# Patient Record
Sex: Male | Born: 1945 | Race: White | Hispanic: No | Smoking: Never smoker
Health system: Southern US, Community
[De-identification: ages and names within clinical notes are randomized; demographics above are authoritative.]

## PROBLEM LIST (undated history)

## (undated) DIAGNOSIS — I10 Essential (primary) hypertension: Secondary | ICD-10-CM

## (undated) DIAGNOSIS — E119 Type 2 diabetes mellitus without complications: Secondary | ICD-10-CM

## (undated) DIAGNOSIS — N179 Acute kidney failure, unspecified: Secondary | ICD-10-CM

## (undated) HISTORY — PX: KNEE ARTHROSCOPY: SUR90

## (undated) HISTORY — PX: PACEMAKER IMPLANT: EP1218

---

## 2013-10-02 ENCOUNTER — Emergency Department (HOSPITAL_BASED_OUTPATIENT_CLINIC_OR_DEPARTMENT_OTHER): Payer: Medicare Other

## 2013-10-02 ENCOUNTER — Encounter (HOSPITAL_BASED_OUTPATIENT_CLINIC_OR_DEPARTMENT_OTHER): Payer: Self-pay | Admitting: Emergency Medicine

## 2013-10-02 ENCOUNTER — Emergency Department (HOSPITAL_BASED_OUTPATIENT_CLINIC_OR_DEPARTMENT_OTHER)
Admission: EM | Admit: 2013-10-02 | Discharge: 2013-10-02 | Disposition: A | Discharge status: Home / Self Care | Payer: Medicare Other | Attending: Emergency Medicine | Admitting: Emergency Medicine

## 2013-10-02 DIAGNOSIS — E119 Type 2 diabetes mellitus without complications: Secondary | ICD-10-CM | POA: Insufficient documentation

## 2013-10-02 DIAGNOSIS — Z79899 Other long term (current) drug therapy: Secondary | ICD-10-CM | POA: Insufficient documentation

## 2013-10-02 DIAGNOSIS — J069 Acute upper respiratory infection, unspecified: Secondary | ICD-10-CM | POA: Insufficient documentation

## 2013-10-02 HISTORY — DX: Type 2 diabetes mellitus without complications: E11.9

## 2013-10-02 MED ORDER — BENZONATATE 100 MG PO CAPS: 100.0000 mg | ORAL_CAPSULE | Freq: Three times a day (TID) | ORAL | Status: DC

## 2013-10-02 NOTE — ED Notes (Signed)
MD at bedside. 

## 2013-10-02 NOTE — ED Notes (Signed)
Pt reports productive cough x couple days.

## 2013-10-02 NOTE — ED Provider Notes (Signed)
CSN: [id number]     Arrival date & time 10/02/13  1443 History  This chart was scribed for Rolland Porter, MD by Charline Bills, ED Scribe. The patient was seen in room MH05/MH05. Patient's care was started at 3:44 PM.   Chief Complaint  Patient presents with  . Cough   The history is provided by the patient. No language interpreter was used.   HPI Comments: Yansel Bentz is a 68 y.o. male, with a h/o DM, who presents to the Emergency Department complaining of dry cough onset yesterday. Pt also reports associated sore throat, rhinorrhea. He denies fever, HA, chest pain. Pt also states that he had a rash a few days ago that resolved after one prednisone injection.   Past Medical History  Diagnosis Date  . Diabetes mellitus without complication    History reviewed. No pertinent past surgical history. History reviewed. No pertinent family history. History  Substance Use Topics  . Smoking status: Never Smoker   . Smokeless tobacco: Not on file  . Alcohol Use: No    Review of Systems  Constitutional: Negative for fever, chills, diaphoresis, appetite change and fatigue.  HENT: Positive for rhinorrhea and sore throat. Negative for mouth sores and trouble swallowing.   Eyes: Negative for visual disturbance.  Respiratory: Positive for cough. Negative for chest tightness, shortness of breath and wheezing.   Cardiovascular: Negative for chest pain.  Gastrointestinal: Negative for nausea, vomiting, abdominal pain, diarrhea and abdominal distention.  Endocrine: Negative for polydipsia, polyphagia and polyuria.  Genitourinary: Negative for dysuria, frequency and hematuria.  Musculoskeletal: Negative for gait problem.  Skin: Negative for color change, pallor and rash.  Neurological: Negative for dizziness, syncope, light-headedness and headaches.  Hematological: Does not bruise/bleed easily.  Psychiatric/Behavioral: Negative for behavioral problems and confusion.  All other systems reviewed and are  negative.  Allergies  Crestor; Iodides; and Sulfa antibiotics  Home Medications   Prior to Admission medications   Medication Sig Start Date End Date Taking? Authorizing Provider  metFORMIN (GLUCOPHAGE) 1000 MG tablet Take 1,000 mg by mouth 2 (two) times daily with a meal.   Yes Historical Provider, MD  sertraline (ZOLOFT) 100 MG tablet Take 100 mg by mouth daily.   Yes Historical Provider, MD  benzonatate (TESSALON) 100 MG capsule Take 1 capsule (100 mg total) by mouth every 8 (eight) hours. 10/02/13   Rolland Porter, MD   Triage Vitals: BP 118/73  Pulse 60  Temp(Src) 98.8 F (37.1 C) (Oral)  Resp 18  Ht 5\' 10"  (1.778 m)  Wt 170 lb (77.111 kg)  BMI 24.39 kg/m2  SpO2 98% Physical Exam  Constitutional: He is oriented to person, place, and time. He appears well-developed and well-nourished. No distress.  HENT:  Head: Normocephalic.  Right Ear: Tympanic membrane normal.  Left Ear: Tympanic membrane normal.  Nose: Nose normal.  Mouth/Throat: Oropharynx is clear and moist and mucous membranes are normal.  Eyes: Conjunctivae are normal. Pupils are equal, round, and reactive to light. No scleral icterus.  Neck: Normal range of motion. Neck supple. No mass and no thyromegaly present.  Cardiovascular: Normal rate and regular rhythm.  Exam reveals no gallop and no friction rub.   No murmur heard. Pulmonary/Chest: Effort normal and breath sounds normal. No respiratory distress. He has no wheezes. He has no rales.  Abdominal: Soft. Bowel sounds are normal. He exhibits no distension. There is no tenderness. There is no rebound.  Musculoskeletal: Normal range of motion.  Lymphadenopathy:    He has no  cervical adenopathy.  Neurological: He is alert and oriented to person, place, and time.  Skin: Skin is warm and dry. No rash noted.  Psychiatric: He has a normal mood and affect. His behavior is normal.    ED Course  Procedures (including critical care time) DIAGNOSTIC STUDIES: Oxygen  Saturation is 98% on RA, normal by my interpretation.    COORDINATION OF CARE: 3:48 PM Discussed treatment plan with pt at bedside and pt agreed to plan.  Labs Review Labs Reviewed - No data to display  Imaging Review Dg Chest 2 View  10/02/2013   CLINICAL DATA:  Productive cough.  Shortness of breath.  EXAM: CHEST  2 VIEW  COMPARISON:  None.  FINDINGS: The heart size and mediastinal contours are within normal limits. Both lungs are clear. The visualized skeletal structures are unremarkable.  IMPRESSION: No active cardiopulmonary disease.   Electronically Signed   By: Maisie Fushomas  Register   On: 10/02/2013 16:17     EKG Interpretation None      MDM   Final diagnoses:  Upper respiratory infection    Normal CXR.  Normal exam.  I personally performed the services described in this documentation, which was scribed in my presence. The recorded information has been reviewed and is accurate.    Rolland PorterMark Yovan Leeman, MD 10/02/13 [phone]

## 2013-10-02 NOTE — ED Notes (Signed)
Pt directed to pharmacy to pick up prescriptions-  

## 2013-10-02 NOTE — Discharge Instructions (Signed)
Upper Respiratory Infection, Adult °An upper respiratory infection (URI) is also known as the common cold. It is often caused by a type of germ (virus). Colds are easily spread (contagious). You can pass it to others by kissing, coughing, sneezing, or drinking out of the same glass. Usually, you get better in 1 or 2 weeks.  °HOME CARE  °· Only take medicine as told by your doctor. °· Use a warm mist humidifier or breathe in steam from a hot shower. °· Drink enough water and fluids to keep your pee (urine) clear or pale yellow. °· Get plenty of rest. °· Return to work when your temperature is back to normal or as told by your doctor. You may use a face mask and wash your hands to stop your cold from spreading. °GET HELP RIGHT AWAY IF:  °· After the first few days, you feel you are getting worse. °· You have questions about your medicine. °· You have chills, shortness of breath, or brown or red spit (mucus). °· You have yellow or brown snot (nasal discharge) or pain in the face, especially when you bend forward. °· You have a fever, puffy (swollen) neck, pain when you swallow, or white spots in the back of your throat. °· You have a bad headache, ear pain, sinus pain, or chest pain. °· You have a high-pitched whistling sound when you breathe in and out (wheezing). °· You have a lasting cough or cough up blood. °· You have sore muscles or a stiff neck. °MAKE SURE YOU:  °· Understand these instructions. °· Will watch your condition. °· Will get help right away if you are not doing well or get worse. °Document Released: 10/07/2007 Document Revised: 07/13/2011 Document Reviewed: 08/25/2010 °ExitCare® Patient Information ©2014 [employer]. ° °Viral Infections °A virus is a type of germ. Viruses can cause: °· Minor sore throats. °· Aches and pains. °· Headaches. °· Runny nose. °· Rashes. °· Watery eyes. °· Tiredness. °· Coughs. °· Loss of appetite. °· Feeling sick to your stomach (nausea). °· Throwing up  (vomiting). °· Watery poop (diarrhea). °HOME CARE  °· Only take medicines as told by your doctor. °· Drink enough water and fluids to keep your pee (urine) clear or pale yellow. Sports drinks are a good choice. °· Get plenty of rest and eat healthy. Soups and broths with crackers or rice are fine. °GET HELP RIGHT AWAY IF:  °· You have a very bad headache. °· You have shortness of breath. °· You have chest pain or neck pain. °· You have an unusual rash. °· You cannot stop throwing up. °· You have watery poop that does not stop. °· You cannot keep fluids down. °· You or your child has a temperature by mouth above 102° F (38.9° C), not controlled by medicine. °· Your baby is older than 3 months with a rectal temperature of 102° F (38.9° C) or higher. °· Your baby is 3 months old or younger with a rectal temperature of 100.4° F (38° C) or higher. °MAKE SURE YOU:  °· Understand these instructions. °· Will watch this condition. °· Will get help right away if you are not doing well or get worse. °Document Released: 04/02/2008 Document Revised: 07/13/2011 Document Reviewed: 08/26/2010 °ExitCare® Patient Information ©2014 [employer]. ° °

## 2015-01-19 ENCOUNTER — Encounter (HOSPITAL_BASED_OUTPATIENT_CLINIC_OR_DEPARTMENT_OTHER): Payer: Self-pay | Admitting: Emergency Medicine

## 2015-01-19 ENCOUNTER — Emergency Department (HOSPITAL_BASED_OUTPATIENT_CLINIC_OR_DEPARTMENT_OTHER): Payer: Medicare Other

## 2015-01-19 ENCOUNTER — Emergency Department (HOSPITAL_BASED_OUTPATIENT_CLINIC_OR_DEPARTMENT_OTHER)
Admission: EM | Admit: 2015-01-19 | Discharge: 2015-01-19 | Disposition: A | Discharge status: Home / Self Care | Payer: Medicare Other | Attending: Emergency Medicine | Admitting: Emergency Medicine

## 2015-01-19 DIAGNOSIS — S81832A Puncture wound without foreign body, left lower leg, initial encounter: Secondary | ICD-10-CM | POA: Insufficient documentation

## 2015-01-19 DIAGNOSIS — Y998 Other external cause status: Secondary | ICD-10-CM | POA: Insufficient documentation

## 2015-01-19 DIAGNOSIS — E119 Type 2 diabetes mellitus without complications: Secondary | ICD-10-CM | POA: Diagnosis not present

## 2015-01-19 DIAGNOSIS — Z23 Encounter for immunization: Secondary | ICD-10-CM | POA: Insufficient documentation

## 2015-01-19 DIAGNOSIS — Z79899 Other long term (current) drug therapy: Secondary | ICD-10-CM | POA: Diagnosis not present

## 2015-01-19 DIAGNOSIS — Y9389 Activity, other specified: Secondary | ICD-10-CM | POA: Insufficient documentation

## 2015-01-19 DIAGNOSIS — S81812A Laceration without foreign body, left lower leg, initial encounter: Secondary | ICD-10-CM | POA: Diagnosis present

## 2015-01-19 DIAGNOSIS — S8012XA Contusion of left lower leg, initial encounter: Secondary | ICD-10-CM | POA: Insufficient documentation

## 2015-01-19 DIAGNOSIS — Z7982 Long term (current) use of aspirin: Secondary | ICD-10-CM | POA: Insufficient documentation

## 2015-01-19 DIAGNOSIS — Y288XXA Contact with other sharp object, undetermined intent, initial encounter: Secondary | ICD-10-CM | POA: Insufficient documentation

## 2015-01-19 DIAGNOSIS — T148XXA Other injury of unspecified body region, initial encounter: Secondary | ICD-10-CM

## 2015-01-19 DIAGNOSIS — Y9289 Other specified places as the place of occurrence of the external cause: Secondary | ICD-10-CM | POA: Insufficient documentation

## 2015-01-19 MED ORDER — CEPHALEXIN 500 MG PO CAPS: 500.0000 mg | ORAL_CAPSULE | Freq: Four times a day (QID) | ORAL | Status: DC

## 2015-01-19 MED ORDER — TETANUS-DIPHTH-ACELL PERTUSSIS 5-2.5-18.5 LF-MCG/0.5 IM SUSP
0.5000 mL | Freq: Once | INTRAMUSCULAR | Status: AC
  Administered 2015-01-19: 0.5 mL via INTRAMUSCULAR
  Filled 2015-01-19: qty 0.5

## 2015-01-19 MED ORDER — CEPHALEXIN 250 MG PO CAPS
500.0000 mg | ORAL_CAPSULE | Freq: Once | ORAL | Status: AC
  Administered 2015-01-19: 500 mg via ORAL
  Filled 2015-01-19: qty 2

## 2015-01-19 NOTE — ED Notes (Signed)
Cut leg with rusty hand sickle yard tool

## 2015-01-19 NOTE — ED Notes (Signed)
Pt states he is currently NOT UTD on Tetanus

## 2015-01-19 NOTE — Discharge Instructions (Signed)
Apply ice for the first 2-3 days and then transition to heat to help break up the hematoma.  Keep wound dry and do not remove dressing for 24 hours if possible. After that, wash gently morning and night (every 12 hours) with soap and water. Use a topical antibiotic ointment and cover with a bandaid or gauze.    Do NOT use rubbing alcohol or hydrogen peroxide, do not soak the area   Every attempt was made to remove foreign body (contaminants) from the wound.  However, there is always a chance that some may remain in the wound. This can  increase your risk of infection.   If you see signs of infection (warmth, redness, tenderness, pus, sharp increase in pain, fever, red streaking in the skin) immediately return to the emergency department.    Please follow with your primary care doctor in the next 2 days for a check-up. They must obtain records for further management.   Do not hesitate to return to the Emergency Department for any new, worsening or concerning symptoms.

## 2015-01-19 NOTE — ED Provider Notes (Signed)
CSN: [id number]     Arrival date & time 01/19/15  1547 History   First MD Initiated Contact with Patient 01/19/15 1620     Chief Complaint  Patient presents with  . Extremity Laceration     (Consider location/radiation/quality/duration/timing/severity/associated sxs/prior Treatment) HPI   Blood pressure 142/95, pulse 58, temperature 98.1 F (36.7 C), temperature source Oral, resp. rate 18, height 5\' 10"  (1.778 m), weight 175 lb (79.379 kg), SpO2 99 %.  Nathaniel Davis is a 69 y.o. male complaining of laceration to left lower extremity after patient cut the leg with a resting hand cycle while working in the yard. States that initially the bleeding was difficult to control these denies pulsatile bleeding. Last tetanus shot is unknown. Pain is mild.  Past Medical History  Diagnosis Date  . Diabetes mellitus without complication    History reviewed. No pertinent past surgical history. No family history on file. Social History  Substance Use Topics  . Smoking status: Never Smoker   . Smokeless tobacco: None  . Alcohol Use: No    Review of Systems  10 systems reviewed and found to be negative, except as noted in the HPI.   Allergies  Crestor; Iodides; and Sulfa antibiotics  Home Medications   Prior to Admission medications   Medication Sig Start Date End Date Taking? Authorizing Geovana Gebel  ALPRAZolam Prudy Feeler) 0.5 MG tablet Take 0.5 mg by mouth at bedtime as needed for anxiety.   Yes Historical Bellarose Burtt, MD  aspirin 81 MG tablet Take 81 mg by mouth daily.   Yes Historical Beauregard Jarrells, MD  atenolol (TENORMIN) 50 MG tablet Take 50 mg by mouth daily.   Yes Historical Haward Pope, MD  carbamazepine (TEGRETOL) 200 MG tablet Take 200 mg by mouth 3 (three) times daily.   Yes Historical Eyla Tallon, MD  diphenoxylate-atropine (LOMOTIL) 2.5-0.025 MG per tablet Take by mouth 4 (four) times daily as needed for diarrhea or loose stools.   Yes Historical Nasim Habeeb, MD  doxepin (SINEQUAN) 25 MG capsule  Take 25 mg by mouth.   Yes Historical Shirlyn Savin, MD  HYDROcodone-acetaminophen (NORCO) 10-325 MG per tablet Take 1 tablet by mouth every 6 (six) hours as needed.   Yes Historical Takyah Ciaramitaro, MD  hydrOXYzine (ATARAX/VISTARIL) 25 MG tablet Take 25 mg by mouth 3 (three) times daily as needed.   Yes Historical Avri Paiva, MD  indomethacin (INDOCIN) 50 MG capsule Take 50 mg by mouth 2 (two) times daily with a meal.   Yes Historical Jerric Oyen, MD  pantoprazole (PROTONIX) 40 MG tablet Take 40 mg by mouth daily.   Yes Historical Tai Skelly, MD  pravastatin (PRAVACHOL) 10 MG tablet Take 10 mg by mouth daily.   Yes Historical Malacki Mcphearson, MD  promethazine (PHENERGAN) 25 MG tablet Take 25 mg by mouth every 6 (six) hours as needed for nausea or vomiting.   Yes Historical Laityn Bensen, MD  benzonatate (TESSALON) 100 MG capsule Take 1 capsule (100 mg total) by mouth every 8 (eight) hours. 10/02/13   Rolland Porter, MD  metFORMIN (GLUCOPHAGE) 1000 MG tablet Take 500 mg by mouth 2 (two) times daily with a meal.     Historical Reghan Thul, MD  sertraline (ZOLOFT) 100 MG tablet Take 100 mg by mouth daily.    Historical Courtnee Myer, MD   BP 142/95 mmHg  Pulse 58  Temp(Src) 98.1 F (36.7 C) (Oral)  Resp 18  Ht 5\' 10"  (1.778 m)  Wt 175 lb (79.379 kg)  BMI 25.11 kg/m2  SpO2 99% Physical Exam  Constitutional: He is oriented to  person, place, and time. He appears well-developed and well-nourished. No distress.  HENT:  Head: Normocephalic and atraumatic.  Mouth/Throat: Oropharynx is clear and moist.  Eyes: Conjunctivae and EOM are normal. Pupils are equal, round, and reactive to light.  Neck: Normal range of motion.  Cardiovascular: Normal rate and regular rhythm.   Pulmonary/Chest: Effort normal and breath sounds normal. No stridor.  Musculoskeletal: Normal range of motion.  Neurological: He is alert and oriented to person, place, and time.  Skin:  6 mm puncture wound to left shin on the medial side with bleeding controlled, there is  a underlying hematoma that is not tender to palpation. He is distally neurovascularly intact, no underlying crepitance.  Psychiatric: He has a normal mood and affect.  Nursing note and vitals reviewed.   ED Course  Procedures (including critical care time) Labs Review Labs Reviewed - No data to display  Imaging Review No results found. I have personally reviewed and evaluated these images and lab results as part of my medical decision-making.   EKG Interpretation None      MDM   Final diagnoses:  Puncture wound  Hematoma    Filed Vitals:   01/19/15 1557  BP: 142/95  Pulse: 58  Temp: 98.1 F (36.7 C)  TempSrc: Oral  Resp: 18  Height:  (1.778 m)  Weight: 175 lb (79.379 kg)  SpO2: 99%    Medications  Tdap (BOOSTRIX) injection 0.5 mL (0.5 mLs Intramuscular Given 01/19/15 1648)  cephALEXin (KEFLEX) capsule 500 mg (500 mg Oral Given 01/19/15 1652)    Nathaniel Davis is a pleasant 69 y.o. male presenting with sites wound to left lower anterior leg, this is a puncture wound. Patient states that this was a rusty told. His tetanus is updated. He is diabetic, will start him on antibiotics. X-ray with no bony abnormality or foreign body. Wound is irrigated and outpatient on wound care and return precautions, this very high risk wound considering the underlying hematoma and his diabetes. Her on prophylactic Keflex.  Evaluation does not show pathology that would require ongoing emergent intervention or inpatient treatment. Pt is hemodynamically stable and mentating appropriately. Discussed findings and plan with patient/guardian, who agrees with care plan. All questions answered. Return precautions discussed and outpatient follow up given.   Discharge Medication List as of 01/19/2015  5:59 PM    START taking these medications   Details  cephALEXin (KEFLEX) 500 MG capsule Take 1 capsule (500 mg total) by mouth 4 (four) times daily., Starting 01/19/2015, Until Discontinued, [employer], PA-C 01/20/15 0011  Elwin Mocha, MD 01/20/15 [phone]

## 2015-01-19 NOTE — ED Notes (Signed)
Pt was using a hedge trimming blade and caught it on his shin.  1cm laceration to left leg, bleeding controlled by pressure dressing.

## 2015-01-19 NOTE — ED Notes (Signed)
Presents with laceration to left ant lower leg area, medial of tibial bone area, no active bleeding noted at this time, area has some swelling noted, pt arrived with 2x2 secured by tape.

## 2015-12-07 ENCOUNTER — Emergency Department (HOSPITAL_BASED_OUTPATIENT_CLINIC_OR_DEPARTMENT_OTHER): Payer: Medicare Other

## 2015-12-07 ENCOUNTER — Encounter (HOSPITAL_BASED_OUTPATIENT_CLINIC_OR_DEPARTMENT_OTHER): Payer: Self-pay | Admitting: *Deleted

## 2015-12-07 ENCOUNTER — Inpatient Hospital Stay (HOSPITAL_BASED_OUTPATIENT_CLINIC_OR_DEPARTMENT_OTHER)
Admission: EM | Admit: 2015-12-07 | Discharge: 2015-12-09 | DRG: 069 | Disposition: A | Discharge status: Home / Self Care | Payer: Medicare Other | Attending: Internal Medicine | Admitting: Internal Medicine

## 2015-12-07 ENCOUNTER — Observation Stay (HOSPITAL_COMMUNITY): Payer: Medicare Other

## 2015-12-07 DIAGNOSIS — Z6825 Body mass index (BMI) 25.0-25.9, adult: Secondary | ICD-10-CM

## 2015-12-07 DIAGNOSIS — I10 Essential (primary) hypertension: Secondary | ICD-10-CM | POA: Diagnosis present

## 2015-12-07 DIAGNOSIS — Z79899 Other long term (current) drug therapy: Secondary | ICD-10-CM

## 2015-12-07 DIAGNOSIS — R42 Dizziness and giddiness: Secondary | ICD-10-CM

## 2015-12-07 DIAGNOSIS — Z7982 Long term (current) use of aspirin: Secondary | ICD-10-CM

## 2015-12-07 DIAGNOSIS — Z888 Allergy status to other drugs, medicaments and biological substances status: Secondary | ICD-10-CM

## 2015-12-07 DIAGNOSIS — G459 Transient cerebral ischemic attack, unspecified: Secondary | ICD-10-CM | POA: Diagnosis not present

## 2015-12-07 DIAGNOSIS — Z882 Allergy status to sulfonamides status: Secondary | ICD-10-CM

## 2015-12-07 DIAGNOSIS — G43909 Migraine, unspecified, not intractable, without status migrainosus: Secondary | ICD-10-CM | POA: Diagnosis present

## 2015-12-07 DIAGNOSIS — F329 Major depressive disorder, single episode, unspecified: Secondary | ICD-10-CM | POA: Diagnosis present

## 2015-12-07 DIAGNOSIS — E119 Type 2 diabetes mellitus without complications: Secondary | ICD-10-CM

## 2015-12-07 DIAGNOSIS — E785 Hyperlipidemia, unspecified: Secondary | ICD-10-CM | POA: Diagnosis present

## 2015-12-07 DIAGNOSIS — R4781 Slurred speech: Secondary | ICD-10-CM | POA: Diagnosis present

## 2015-12-07 DIAGNOSIS — R270 Ataxia, unspecified: Secondary | ICD-10-CM

## 2015-12-07 DIAGNOSIS — N179 Acute kidney failure, unspecified: Secondary | ICD-10-CM | POA: Diagnosis present

## 2015-12-07 DIAGNOSIS — R29701 NIHSS score 1: Secondary | ICD-10-CM | POA: Diagnosis present

## 2015-12-07 DIAGNOSIS — R001 Bradycardia, unspecified: Secondary | ICD-10-CM | POA: Diagnosis present

## 2015-12-07 DIAGNOSIS — Z7984 Long term (current) use of oral hypoglycemic drugs: Secondary | ICD-10-CM

## 2015-12-07 HISTORY — DX: Acute kidney failure, unspecified: N17.9

## 2015-12-07 HISTORY — DX: Essential (primary) hypertension: I10

## 2015-12-07 LAB — DIFFERENTIAL
Basophils Absolute: 0 10*3/uL (ref 0.0–0.1)
Basophils Relative: 0 %
Eosinophils Absolute: 0.2 10*3/uL (ref 0.0–0.7)
Eosinophils Relative: 3 %
LYMPHS PCT: 28 %
Lymphs Abs: 1.9 10*3/uL (ref 0.7–4.0)
MONO ABS: 0.4 10*3/uL (ref 0.1–1.0)
Monocytes Relative: 6 %
NEUTROS ABS: 4.2 10*3/uL (ref 1.7–7.7)
Neutrophils Relative %: 63 %

## 2015-12-07 LAB — CBC
HCT: 37.7 % — ABNORMAL LOW (ref 39.0–52.0)
HCT: 39.5 % (ref 39.0–52.0)
HEMOGLOBIN: 12.5 g/dL — AB (ref 13.0–17.0)
Hemoglobin: 12.8 g/dL — ABNORMAL LOW (ref 13.0–17.0)
MCH: 31 pg (ref 26.0–34.0)
MCH: 30.5 pg (ref 26.0–34.0)
MCHC: 33.2 g/dL (ref 30.0–36.0)
MCHC: 32.4 g/dL (ref 30.0–36.0)
MCV: 93.5 fL (ref 78.0–100.0)
MCV: 94 fL (ref 78.0–100.0)
PLATELETS: 148 10*3/uL — AB (ref 150–400)
PLATELETS: 170 10*3/uL (ref 150–400)
RBC: 4.03 MIL/uL — AB (ref 4.22–5.81)
RBC: 4.2 MIL/uL — AB (ref 4.22–5.81)
RDW: 12.6 % (ref 11.5–15.5)
RDW: 12.8 % (ref 11.5–15.5)
WBC: 6.7 10*3/uL (ref 4.0–10.5)
WBC: 7.6 10*3/uL (ref 4.0–10.5)

## 2015-12-07 LAB — URINALYSIS, ROUTINE W REFLEX MICROSCOPIC
Bilirubin Urine: NEGATIVE
Glucose, UA: NEGATIVE mg/dL
Hgb urine dipstick: NEGATIVE
Ketones, ur: NEGATIVE mg/dL
Leukocytes, UA: NEGATIVE
NITRITE: NEGATIVE
PROTEIN: NEGATIVE mg/dL
Specific Gravity, Urine: 1.021 (ref 1.005–1.030)
pH: 6.5 (ref 5.0–8.0)

## 2015-12-07 LAB — COMPREHENSIVE METABOLIC PANEL
ALT: 24 U/L (ref 17–63)
AST: 26 U/L (ref 15–41)
Albumin: 4.2 g/dL (ref 3.5–5.0)
Alkaline Phosphatase: 67 U/L (ref 38–126)
Anion gap: 7 (ref 5–15)
BUN: 28 mg/dL — ABNORMAL HIGH (ref 6–20)
CALCIUM: 8.9 mg/dL (ref 8.9–10.3)
CO2: 28 mmol/L (ref 22–32)
CREATININE: 1.31 mg/dL — AB (ref 0.61–1.24)
Chloride: 104 mmol/L (ref 101–111)
GFR calc non Af Amer: 54 mL/min — ABNORMAL LOW (ref >60)
GLUCOSE: 135 mg/dL — AB (ref 65–99)
Potassium: 4.6 mmol/L (ref 3.5–5.1)
SODIUM: 139 mmol/L (ref 135–145)
Total Bilirubin: 0.5 mg/dL (ref 0.3–1.2)
Total Protein: 6.9 g/dL (ref 6.5–8.1)

## 2015-12-07 LAB — RAPID URINE DRUG SCREEN, HOSP PERFORMED
Amphetamines: NOT DETECTED
Barbiturates: NOT DETECTED
Benzodiazepines: POSITIVE — AB
Cocaine: NOT DETECTED
Opiates: NOT DETECTED
Tetrahydrocannabinol: NOT DETECTED

## 2015-12-07 LAB — CREATININE, SERUM
CREATININE: 1.22 mg/dL (ref 0.61–1.24)
GFR, EST NON AFRICAN AMERICAN: 58 mL/min — AB (ref >60)

## 2015-12-07 LAB — GLUCOSE, CAPILLARY
Glucose-Capillary: 126 mg/dL — ABNORMAL HIGH (ref 65–99)
Glucose-Capillary: 126 mg/dL — ABNORMAL HIGH (ref 65–99)

## 2015-12-07 LAB — TSH: TSH: 0.945 u[IU]/mL (ref 0.350–4.500)

## 2015-12-07 LAB — PROTIME-INR
INR: 1.1
Prothrombin Time: 14.2 seconds (ref 11.4–15.2)

## 2015-12-07 LAB — APTT: aPTT: 33 seconds (ref 24–36)

## 2015-12-07 LAB — TROPONIN I: Troponin I: <0.03 ng/mL (ref <0.03)

## 2015-12-07 LAB — ETHANOL: Alcohol, Ethyl (B): <5 mg/dL (ref <5)

## 2015-12-07 MED ORDER — METOCLOPRAMIDE HCL 5 MG/ML IJ SOLN
10.0000 mg | Freq: Once | INTRAMUSCULAR | Status: AC
  Administered 2015-12-07: 10 mg via INTRAVENOUS
  Filled 2015-12-07: qty 2

## 2015-12-07 MED ORDER — OXYCODONE HCL 5 MG PO TABS
5.0000 mg | ORAL_TABLET | Freq: Four times a day (QID) | ORAL | Status: DC | PRN
  Administered 2015-12-07 – 2015-12-08 (×3): 5 mg via ORAL
  Filled 2015-12-07 (×4): qty 1

## 2015-12-07 MED ORDER — SODIUM CHLORIDE 0.9 % IV SOLN
INTRAVENOUS | Status: DC
  Administered 2015-12-07 – 2015-12-08 (×2): via INTRAVENOUS

## 2015-12-07 MED ORDER — ACETAMINOPHEN 325 MG PO TABS
650.0000 mg | ORAL_TABLET | Freq: Four times a day (QID) | ORAL | Status: DC | PRN
  Administered 2015-12-07: 650 mg via ORAL
  Filled 2015-12-07: qty 2

## 2015-12-07 MED ORDER — ACETAMINOPHEN 650 MG RE SUPP: 650.0000 mg | Freq: Four times a day (QID) | RECTAL | Status: DC | PRN

## 2015-12-07 MED ORDER — CLOPIDOGREL BISULFATE 75 MG PO TABS
300.0000 mg | ORAL_TABLET | Freq: Once | ORAL | Status: AC
  Administered 2015-12-08: 300 mg via ORAL
  Filled 2015-12-07: qty 4

## 2015-12-07 MED ORDER — ONDANSETRON HCL 4 MG PO TABS: 4.0000 mg | ORAL_TABLET | Freq: Four times a day (QID) | ORAL | Status: DC | PRN

## 2015-12-07 MED ORDER — BENZONATATE 100 MG PO CAPS
100.0000 mg | ORAL_CAPSULE | Freq: Three times a day (TID) | ORAL | Status: DC
  Administered 2015-12-07 – 2015-12-09 (×6): 100 mg via ORAL
  Filled 2015-12-07 (×6): qty 1

## 2015-12-07 MED ORDER — ASPIRIN EC 81 MG PO TBEC: 81.0000 mg | DELAYED_RELEASE_TABLET | Freq: Every day | ORAL | Status: DC

## 2015-12-07 MED ORDER — BISACODYL 5 MG PO TBEC: 5.0000 mg | DELAYED_RELEASE_TABLET | Freq: Every day | ORAL | Status: DC | PRN

## 2015-12-07 MED ORDER — DOXEPIN HCL 25 MG PO CAPS
25.0000 mg | ORAL_CAPSULE | Freq: Every day | ORAL | Status: DC
  Administered 2015-12-07: 25 mg via ORAL
  Filled 2015-12-07 (×3): qty 1

## 2015-12-07 MED ORDER — ONDANSETRON HCL 4 MG/2ML IJ SOLN: 4.0000 mg | Freq: Four times a day (QID) | INTRAMUSCULAR | Status: DC | PRN

## 2015-12-07 MED ORDER — DOXEPIN HCL 75 MG PO CAPS
75.0000 mg | ORAL_CAPSULE | Freq: Once | ORAL | Status: AC
  Administered 2015-12-07: 75 mg via ORAL
  Filled 2015-12-07: qty 1

## 2015-12-07 MED ORDER — DOXEPIN HCL 100 MG PO CAPS
100.0000 mg | ORAL_CAPSULE | Freq: Every day | ORAL | Status: DC
  Administered 2015-12-08: 100 mg via ORAL
  Filled 2015-12-07: qty 1

## 2015-12-07 MED ORDER — CLOPIDOGREL BISULFATE 75 MG PO TABS
75.0000 mg | ORAL_TABLET | Freq: Every day | ORAL | Status: DC
  Administered 2015-12-08 – 2015-12-09 (×2): 75 mg via ORAL
  Filled 2015-12-07 (×2): qty 1

## 2015-12-07 MED ORDER — INSULIN ASPART 100 UNIT/ML ~~LOC~~ SOLN: 0.0000 [IU] | Freq: Every day | SUBCUTANEOUS | Status: DC

## 2015-12-07 MED ORDER — PANTOPRAZOLE SODIUM 40 MG PO TBEC
40.0000 mg | DELAYED_RELEASE_TABLET | Freq: Every day | ORAL | Status: DC
  Administered 2015-12-08 – 2015-12-09 (×2): 40 mg via ORAL
  Filled 2015-12-07 (×2): qty 1

## 2015-12-07 MED ORDER — PRAVASTATIN SODIUM 20 MG PO TABS
10.0000 mg | ORAL_TABLET | Freq: Every day | ORAL | Status: DC
  Administered 2015-12-08: 10 mg via ORAL
  Filled 2015-12-07: qty 1

## 2015-12-07 MED ORDER — SERTRALINE HCL 100 MG PO TABS
100.0000 mg | ORAL_TABLET | Freq: Every day | ORAL | Status: DC
  Administered 2015-12-08 – 2015-12-09 (×2): 100 mg via ORAL
  Filled 2015-12-07 (×2): qty 1

## 2015-12-07 MED ORDER — INSULIN ASPART 100 UNIT/ML ~~LOC~~ SOLN
0.0000 [IU] | Freq: Three times a day (TID) | SUBCUTANEOUS | Status: DC
  Administered 2015-12-07 – 2015-12-08 (×3): 1 [IU] via SUBCUTANEOUS

## 2015-12-07 MED ORDER — ENOXAPARIN SODIUM 40 MG/0.4ML ~~LOC~~ SOLN
40.0000 mg | SUBCUTANEOUS | Status: DC
  Administered 2015-12-07 – 2015-12-08 (×2): 40 mg via SUBCUTANEOUS
  Filled 2015-12-07 (×2): qty 0.4

## 2015-12-07 MED ORDER — SODIUM CHLORIDE 0.9% FLUSH
3.0000 mL | Freq: Two times a day (BID) | INTRAVENOUS | Status: DC
  Administered 2015-12-07 – 2015-12-09 (×4): 3 mL via INTRAVENOUS

## 2015-12-07 NOTE — Progress Notes (Signed)
New admission received from [facility], vitals stable, orthostatic bp is stable, MRI done, results pending, pt is comfortable this time, aware of safety precautions, will continue to monitor.

## 2015-12-07 NOTE — Consult Note (Signed)
Neurology Consultation Reason for Consult: Dizziness Referring Physician: Mariea Clonts, C  CC: Slurred Speech  History is obtained from:patient  HPI: Nathaniel Davis is a 70 y.o. male with a history of diabetes, hypertension who presents with episode of slurred speech, vertigo and ataxia. He states that he had his first episode a few months ago, this was treated as TIA and he was hospitalized at [facility]. He then had another episode about 2 weeks ago. He describes that each episode begins with eye pain and then he notices that he is unsteady and becomes acutely nauseous. He did not vomit with the one today, but did have some nausea.   This morning, he was with his daughter and began to sway, and slur his speech. He had a BP checked  while symptomatic which was 190 systolic.    he does have a history of migraines, but did not have any aura with his migraines up until he began having these episodes 3 weeks ago. He estimates that he has had 3 or 4 episodes total.  He still has some left retro-orbital headache now.  LKW: Currently back to baseline  tpa given?: no, resolve symptoms     ROS: A 14 point ROS was performed and is negative except as noted in the HPI.  Past Medical History:  Diagnosis Date  . AKI (acute kidney injury) (HCC)   . Diabetes mellitus without complication (HCC)   . Hypertension      Family History  Problem Relation Age of Onset  . Diabetes Mother   . Hypertension Father      Social History:  reports that he has never smoked. He has never used smokeless tobacco. He reports that he drinks alcohol. He reports that he does not use drugs.   Exam: Current vital signs: BP (!) 157/76 (BP Location: Left Arm)   Pulse (!) 50   Temp 97.9 F (36.6 C) (Oral)   Resp 18   Ht 5\' 9"  (1.753 m)   Wt 77.1 kg (170 lb) Comment: scale b  SpO2 95%   BMI 25.10 kg/m  Vital signs in last 24 hours: Temp:  [97.9 F (36.6 C)-99 F (37.2 C)] 97.9 F (36.6 C) (08/05  2045) Pulse Rate:  [45-52] 50 (08/05 2045) Resp:  [13-21] 18 (08/05 2045) BP: (133-197)/(70-100) 157/76 (08/05 2045) SpO2:  [95 %-99 %] 95 % (08/05 2045) Weight:  [77.1 kg (170 lb)-79.4 kg (175 lb)] 77.1 kg (170 lb) (08/05 1516)   Physical Exam  Constitutional: Appears well-developed and well-nourished.  Psych: Affect appropriate to situation Eyes: No scleral injection HENT: No OP obstrucion Head: Normocephalic.  Cardiovascular: Normal rate and regular rhythm.  Respiratory: Effort normal and breath sounds normal to anterior ascultation GI: Soft.  No distension. There is no tenderness.  Skin: WDI  Neuro: Mental Status: Patient is awake, alert, oriented to person, place, month, year, and situation. Patient is able to give a clear and coherent history. No signs of aphasia or neglect Cranial Nerves: II: Visual Fields are full. Pupils are equal, round, and reactive to light.   III,IV, VI: EOMI without ptosis or diploplia.  V: Facial sensation is symmetric to temperature VII: Facial movement is symmetric.  VIII: hearing is intact to voice X: Uvula elevates symmetrically XI: Shoulder shrug is symmetric. XII: tongue is midline without atrophy or fasciculations.  Motor: Tone is normal. Bulk is normal. 5/5 strength was present in all four extremities.  Sensory: Sensation is symmetric to light touch and temperature in  the arms and legs. Cerebellar: FNF and HKS are intact bilaterally   I have reviewed labs in epic and the results pertinent to this consultation are: Creatinine 1.3   I have reviewed the images obtained:MRI brain-no acute infarct   Impression: 70 year old male with recurrent episodes of vertigo, nausea, ataxia, slurred speech concerning for posterior circulation TIA. Given the increasing frequency, I think it would be reasonable at this time to start with dual platelet therapy. The correlation of a retro-orbital headache on the left with these episodes does raise the  possibility that, acute migraine could be playing a role, however given that he has stroke risk factors and has never had competent migraine in the past makes me think that this is much more likely to be TIA and I would favor treating it as such.   Recommendations: 1. HgbA1c, fasting lipid panel 2. MRA  of the brain without contrast, MR angiogram of the neck with/without contrast 3. Frequent neuro checks 4. Echocardiogram 5. Carotid Dopplers are not needed given MRA 6. Prophylactic therapy-Antiplatelet med: Aspirin - dose  PO or  PR plus Plavix 75 mg daily. I will give him a single dose of 300 mg 1 of Plavix. He will not need this long-term, but I would favor continuing it for a period of time given the increasing frequency of his spells, followed by Plavix monotherapy 7. Risk factor modification 8. Telemetry monitoring 9. please page stroke NP  Or  PA  Or MD  from 8am -4 pm starting 8/6 as this patient will be followed by the stroke team at this point.   You can look them up on [email or web address]      Ritta Slot, MD [facility] [phone]  If 7pm- 7am, please page neurology on call as listed in AMION.

## 2015-12-07 NOTE — ED Provider Notes (Signed)
[facility] Provider Note   CSN: [id number] Arrival date & time: 12/07/15  1037  First Provider Contact:  First MD Initiated Contact with Patient 12/07/15 1054        History   Chief Complaint Chief Complaint  Patient presents with  . Dizziness    HPI Nathaniel Davis is a 70 y.o. male.  Patient with a history of diabetes, hypertension and hyperlipidemia presents with dizziness. He states at 10:00 this morning, after eating breakfast in a restaurant, he began to feel dizzy. He denies any sensation the room is spinning. He denies any nausea or vomiting. He started having some slurred speech as well and his daughter states that he was having difficulty with word finding. He also has ataxia on ambulation. He states his symptoms are better but he is not back to normal. He still feels like his speech is a little slurred and he still feels dizzy. He's had 2 prior episodes over the last 3 weeks for similar events of dizziness and slurred speech. He is seeing both his PCP and ENT. He was placed on antibiotics for a possible ear infection. He denies any chest pain or shortness of breath. He is noted to be bradycardic he says heart rate is usually in the mid 50s. He denies any numbness or weakness in his arms or his legs. He denies any vision loss. He denies any facial drooping.      Past Medical History:  Diagnosis Date  . Diabetes mellitus without complication (HCC)   . Hypertension     Patient Active Problem List   Diagnosis Date Noted  . Dizziness 12/07/2015    History reviewed. No pertinent surgical history.     Home Medications    Prior to Admission medications   Medication Sig Start Date End Date Taking? Authorizing Provider  ALPRAZolam Prudy Feeler) 0.5 MG tablet Take 0.5 mg by mouth at bedtime as needed for anxiety.   Yes Historical Provider, MD  aspirin 81 MG tablet Take 81 mg by mouth daily.   Yes Historical Provider, MD  atenolol (TENORMIN) 50 MG tablet Take 50 mg  by mouth daily.   Yes Historical Provider, MD  carbamazepine (TEGRETOL) 200 MG tablet Take 200 mg by mouth 3 (three) times daily.   Yes Historical Provider, MD  diphenoxylate-atropine (LOMOTIL) 2.5-0.025 MG per tablet Take by mouth 4 (four) times daily as needed for diarrhea or loose stools.   Yes Historical Provider, MD  doxepin (SINEQUAN) 25 MG capsule Take 25 mg by mouth.   Yes Historical Provider, MD  HYDROcodone-acetaminophen (NORCO) 10-325 MG per tablet Take 1 tablet by mouth every 6 (six) hours as needed.   Yes Historical Provider, MD  hydrOXYzine (ATARAX/VISTARIL) 25 MG tablet Take 25 mg by mouth 3 (three) times daily as needed.   Yes Historical Provider, MD  indomethacin (INDOCIN) 50 MG capsule Take 50 mg by mouth 2 (two) times daily with a meal.   Yes Historical Provider, MD  metFORMIN (GLUCOPHAGE) 1000 MG tablet Take 500 mg by mouth 2 (two) times daily with a meal.    Yes Historical Provider, MD  pantoprazole (PROTONIX) 40 MG tablet Take 40 mg by mouth daily.   Yes Historical Provider, MD  pravastatin (PRAVACHOL) 10 MG tablet Take 10 mg by mouth daily.   Yes Historical Provider, MD  promethazine (PHENERGAN) 25 MG tablet Take 25 mg by mouth every 6 (six) hours as needed for nausea or vomiting.   Yes Historical Provider, MD  sertraline (ZOLOFT)  100 MG tablet Take 100 mg by mouth daily.   Yes Historical Provider, MD  benzonatate (TESSALON) 100 MG capsule Take 1 capsule (100 mg total) by mouth every 8 (eight) hours. 10/02/13   Rolland Porter, MD  cephALEXin (KEFLEX) 500 MG capsule Take 1 capsule (500 mg total) by mouth 4 (four) times daily. 01/19/15   Joni Reining Pisciotta, PA-C    Family History No family history on file.  Social History Social History  Substance Use Topics  . Smoking status: Never Smoker  . Smokeless tobacco: Never Used  . Alcohol use Yes     Comment: occasionally monthly     Allergies   Crestor [rosuvastatin]; Iodides; and Sulfa antibiotics   Review of Systems Review  of Systems  Constitutional: Negative for chills, diaphoresis, fatigue and fever.  HENT: Negative for congestion, rhinorrhea and sneezing.   Eyes: Negative.   Respiratory: Negative for cough, chest tightness and shortness of breath.   Cardiovascular: Negative for chest pain and leg swelling.  Gastrointestinal: Negative for abdominal pain, blood in stool, diarrhea, nausea and vomiting.  Genitourinary: Negative for difficulty urinating, flank pain, frequency and hematuria.  Musculoskeletal: Negative for arthralgias and back pain.  Skin: Negative for rash.  Neurological: Positive for dizziness and speech difficulty. Negative for weakness, numbness and headaches.     Physical Exam Updated Vital Signs BP 162/78   Pulse (!) 45   Temp 99 F (37.2 C) (Oral)   Resp 13   Ht 5\' 9"  (1.753 m)   Wt 175 lb (79.4 kg)   SpO2 99%   BMI 25.84 kg/m   Physical Exam  Constitutional: He is oriented to person, place, and time. He appears well-developed and well-nourished.  HENT:  Head: Normocephalic and atraumatic.  Eyes: Pupils are equal, round, and reactive to light.  Neck: Normal range of motion. Neck supple.  Cardiovascular: Normal rate, regular rhythm and normal heart sounds.   Pulmonary/Chest: Effort normal and breath sounds normal. No respiratory distress. He has no wheezes. He has no rales. He exhibits no tenderness.  Abdominal: Soft. Bowel sounds are normal. There is no tenderness. There is no rebound and no guarding.  Musculoskeletal: Normal range of motion. He exhibits no edema.  Lymphadenopathy:    He has no cervical adenopathy.  Neurological: He is alert and oriented to person, place, and time.  Motor 5 out of 5 all extremities, sensation grossly intact to light touch all extremities, no pronator drift, finger to nose intact, visual fields full to confrontation. Cranial nerves II through XII grossly intact  Skin: Skin is warm and dry. No rash noted.  Psychiatric: He has a normal mood  and affect.     ED Treatments / Results  Labs (all labs ordered are listed, but only abnormal results are displayed) Labs Reviewed  CBC - Abnormal; Notable for the following:       Result Value   RBC 4.03 (*)    Hemoglobin 12.5 (*)    HCT 37.7 (*)    Platelets 148 (*)    All other components within normal limits  COMPREHENSIVE METABOLIC PANEL - Abnormal; Notable for the following:    Glucose, Bld 135 (*)    BUN 28 (*)    Creatinine, Ser 1.31 (*)    GFR calc non Af Amer 54 (*)    All other components within normal limits  ETHANOL  PROTIME-INR  APTT  DIFFERENTIAL  TROPONIN I  URINE RAPID DRUG SCREEN, HOSP PERFORMED  URINALYSIS, ROUTINE W REFLEX MICROSCOPIC (  NOT AT [facility])    EKG  EKG Interpretation  Date/Time:  Saturday December 07 2015 11:36:54 EDT Ventricular Rate:  44 PR Interval:    QRS Duration: 113 QT Interval:  461 QTC Calculation: 395 R Axis:   27 Text Interpretation:  Sinus bradycardia Borderline intraventricular conduction delay Minimal ST depression, inferior leads No old tracing to compare Confirmed by Baylin Cabal  MD, Loranda Mastel ([id number]) on 12/07/2015 11:42:03 AM Also confirmed by Cheryn Lundquist  MD, Draxton Luu ([id number]), editor Whitney Post, Cala Bradford [phone])  on 12/07/2015 11:54:26 AM       Radiology Ct Head Wo Contrast  Result Date: 12/07/2015 CLINICAL DATA:  Pt reports dizziness x approx 2wks -- reports it always occurs during activity. Denies chest pain, sob. Reports associated n/v at times, slurred speech Hx HTN, DM EXAM: CT HEAD WITHOUT CONTRAST TECHNIQUE: Contiguous axial images were obtained from the base of the skull through the vertex without intravenous contrast. COMPARISON:  None. FINDINGS: No acute intracranial hemorrhage. No focal mass lesion. No CT evidence of acute infarction. No midline shift or mass effect. No hydrocephalus. Basilar cisterns are patent. Paranasal sinuses and mastoid air cells are clear. Orbits are clear. IMPRESSION: No acute intracranial findings.  Normal head CT  for age. Electronically Signed   By: Genevive Bi M.D.   On: 12/07/2015 12:33    Procedures Procedures (including critical care time)  Medications Ordered in ED Medications - No data to display   Initial Impression / Assessment and Plan / ED Course  I have reviewed the triage vital signs and the nursing notes.  Pertinent labs & imaging results that were available during my care of the patient were reviewed by me and considered in my medical decision making (see chart for details).  Clinical Course  Comment By Time  PT with NIHSS 0 now, but still dizzy.  Paged neuro to see if they want code stroke activated Rolan Bucco, MD 08/05 1120  Dr. Roxy Manns advised against calling code stroke  Rolan Bucco, MD 08/05 1223    CT neg, will admit to [facility].  Spoke with Dr. Mariea Clonts who has accepted pt for transfer.  Dr. Roxy Manns to consult.  Final Clinical Impressions(s) / ED Diagnoses   Final diagnoses:  Dizziness    New Prescriptions New Prescriptions   No medications on file     Rolan Bucco, MD 12/07/15 1342

## 2015-12-07 NOTE — ED Triage Notes (Signed)
Pt reports dizziness x approx 2wks -- reports it always occurs during activity. Denies chest pain, sob. Reports associated n/v at times, slurred speech. Pt reports seeing PCP and ENT for this problem.

## 2015-12-07 NOTE — H&P (Signed)
History and Physical    [name]:[id number] DOB: July 01, 1945 DOA: 12/07/2015  PCP: Cheral Bay, MD Patient coming from: home/appointment center  Chief Complaint: Dizziness ataxia  HPI: Nathaniel Davis is a 70 y.o. male with medical history significant for diabetes, hypertension, hyperlipidemia, presents to the appointment Center with chief complaint of dizziness ataxia. Initial evaluation at [facility] med center concerning for TIA neurology recommends transfer to [facility] for workup.   He reports sudden onset dizziness after breakfast. Associated symptoms include slurred speech and family indicates words searching and unsteady gait. Symptoms lasted only a few minutes. He reports history of same over the last 3 weeks. He saw his PCP and ENT was placed on antibiotics for possible ear infection. He denies headache visual disturbances chest pain palpitation shortness of breath diaphoresis nausea vomiting. Denies any difficulty swallowing. He denies numbness or tingling in his extremities. He denies any facial droop. He denies abdominal pain dysuria hematuria frequency or urgency. He denies diarrhea constipation melena or bright red blood per rectum.    ED Course: At [facility] he's afebrile hemodynamically stable and not hypoxic.NIHSS but dizziness persisted. M.D. spoke to neuro who advised against calling code stroke recommended transferring to [facility] for admission.  Review of Systems: As per HPI otherwise 10 point review of systems negative.   Ambulatory Status: Ambulates independently with steady gait no recent falls  Past Medical History:  Diagnosis Date  . AKI (acute kidney injury) (HCC)   . Diabetes mellitus without complication (HCC)   . Hypertension     History reviewed. No pertinent surgical history.  Social History   Social History  . Marital status: Married    Spouse name: N/A  . Number of children: N/A  . Years of education: N/A   Occupational History  .  Not on file.   Social History Main Topics  . Smoking status: Never Smoker  . Smokeless tobacco: Never Used  . Alcohol use Yes     Comment: occasionally monthly  . Drug use: No  . Sexual activity: Not on file   Other Topics Concern  . Not on file   Social History Narrative  . No narrative on file    Allergies  Allergen Reactions  . Crestor [Rosuvastatin]   . Iodides   . Sulfa Antibiotics     Family History  Problem Relation Age of Onset  . Diabetes Mother   . Hypertension Father     Prior to Admission medications   Medication Sig Start Date End Date Taking? Authorizing Provider  ALPRAZolam Prudy Feeler) 0.5 MG tablet Take 0.5 mg by mouth at bedtime as needed for anxiety.    Historical Provider, MD  aspirin 81 MG tablet Take 81 mg by mouth daily.    Historical Provider, MD  atenolol (TENORMIN) 50 MG tablet Take 50 mg by mouth daily.    Historical Provider, MD  benzonatate (TESSALON) 100 MG capsule Take 1 capsule (100 mg total) by mouth every 8 (eight) hours. 10/02/13   Rolland Porter, MD  carbamazepine (TEGRETOL) 200 MG tablet Take 200 mg by mouth 3 (three) times daily.    Historical Provider, MD  cephALEXin (KEFLEX) 500 MG capsule Take 1 capsule (500 mg total) by mouth 4 (four) times daily. 01/19/15   Nicole Pisciotta, PA-C  diphenoxylate-atropine (LOMOTIL) 2.5-0.025 MG per tablet Take by mouth 4 (four) times daily as needed for diarrhea or loose stools.    Historical Provider, MD  doxepin (SINEQUAN) 25 MG capsule Take 25 mg  by mouth.    Historical Provider, MD  HYDROcodone-acetaminophen (NORCO) 10-325 MG per tablet Take 1 tablet by mouth every 6 (six) hours as needed.    Historical Provider, MD  hydrOXYzine (ATARAX/VISTARIL) 25 MG tablet Take 25 mg by mouth 3 (three) times daily as needed.    Historical Provider, MD  indomethacin (INDOCIN) 50 MG capsule Take 50 mg by mouth 2 (two) times daily with a meal.    Historical Provider, MD  metFORMIN (GLUCOPHAGE) 1000 MG tablet Take 500 mg by  mouth 2 (two) times daily with a meal.     Historical Provider, MD  pantoprazole (PROTONIX) 40 MG tablet Take 40 mg by mouth daily.    Historical Provider, MD  pravastatin (PRAVACHOL) 10 MG tablet Take 10 mg by mouth daily.    Historical Provider, MD  promethazine (PHENERGAN) 25 MG tablet Take 25 mg by mouth every 6 (six) hours as needed for nausea or vomiting.    Historical Provider, MD  sertraline (ZOLOFT) 100 MG tablet Take 100 mg by mouth daily.    Historical Provider, MD    Physical Exam: Vitals:   12/07/15 1330 12/07/15 1411 12/07/15 1412 12/07/15 1516  BP: 133/88 154/73  (!) 168/70  Pulse: (!) 47  (!) 48 (!) 50  Resp: Temp:    98.8 F (37.1 C)  TempSrc:    Oral  SpO2: 99%  99% 99%  Weight:    77.1 kg (170 lb)  Height:     (1.753 m)     General:  Appears calm and comfortable Eyes:  PERRL, EOMI, normal lids, iris ENT:  grossly normal hearing, lips & tongue, His membranes of his mouth are moist and pink Neck:  no LAD, masses or thyromegaly Cardiovascular:  RRR, no m/r/g. No LE edema.  Respiratory:  CTA bilaterally, no w/r/r. Normal respiratory effort. Abdomen:  soft, ntnd, positive bowel sounds no guarding or rebounding Skin:  no rash or induration seen on limited exam Musculoskeletal:  grossly normal tone BUE/BLE, good ROM, no bony abnormality Psychiatric:  grossly normal mood and affect, speech fluent and appropriate, AOx3 Neurologic:  CN 2-12 grossly intact, moves all extremities in coordinated fashion, sensation intact bilateral strength 5 out of 5 speech clear facial symmetry tongue midline sensation intact  Labs on Admission: I have personally reviewed following labs and imaging studies  CBC:  Recent Labs Lab 12/07/15 1145 12/07/15 1527  WBC 6.7 7.6  NEUTROABS 4.2  --   HGB 12.5* 12.8*  HCT 37.7* 39.5  MCV 93.5 94.0  PLT 148* 170   Basic Metabolic Panel:  Recent Labs Lab 12/07/15 1145 12/07/15 1527  NA 139  --   K 4.6  --   CL 104   --   CO2 28  --   GLUCOSE 135*  --   BUN 28*  --   CREATININE 1.31* 1.22  CALCIUM 8.9  --    GFR: Estimated Creatinine Clearance: 56.3 mL/min (by C-G formula based on SCr of 1.22 mg/dL). Liver Function Tests:  Recent Labs Lab 12/07/15 1145  AST 26  ALT 24  ALKPHOS 67  BILITOT 0.5  PROT 6.9  ALBUMIN 4.2   No results for input(s): LIPASE, AMYLASE in the last 168 hours. No results for input(s): AMMONIA in the last 168 hours. Coagulation Profile:  Recent Labs Lab 12/07/15 1145  INR 1.10   Cardiac Enzymes:  Recent Labs Lab 12/07/15 1145  TROPONINI <0.03   BNP (last 3 results)  No results for input(s): PROBNP in the last 8760 hours. HbA1C: No results for input(s): HGBA1C in the last 72 hours. CBG:  Recent Labs Lab 12/07/15 1621  GLUCAP 126*   Lipid Profile: No results for input(s): CHOL, HDL, LDLCALC, TRIG, CHOLHDL, LDLDIRECT in the last 72 hours. Thyroid Function Tests:  Recent Labs  12/07/15 1527  TSH 0.945   Anemia Panel: No results for input(s): VITAMINB12, FOLATE, FERRITIN, TIBC, IRON, RETICCTPCT in the last 72 hours. Urine analysis:    Component Value Date/Time   COLORURINE YELLOW 12/07/2015 1330   APPEARANCEUR CLEAR 12/07/2015 1330   LABSPEC 1.021 12/07/2015 1330   PHURINE 6.5 12/07/2015 1330   GLUCOSEU NEGATIVE 12/07/2015 1330   HGBUR NEGATIVE 12/07/2015 1330   BILIRUBINUR NEGATIVE 12/07/2015 1330   KETONESUR NEGATIVE 12/07/2015 1330   PROTEINUR NEGATIVE 12/07/2015 1330   NITRITE NEGATIVE 12/07/2015 1330   LEUKOCYTESUR NEGATIVE 12/07/2015 1330    Creatinine Clearance: Estimated Creatinine Clearance: 56.3 mL/min (by C-G formula based on SCr of 1.22 mg/dL).  Sepsis Labs: @LABRCNTIP (procalcitonin:4,lacticidven:4) )No results found for this or any previous visit (from the past 240 hour(s)).   Radiological Exams on Admission: Ct Head Wo Contrast  Result Date: 12/07/2015 CLINICAL DATA:  Pt reports dizziness x approx 2wks -- reports it  always occurs during activity. Denies chest pain, sob. Reports associated n/v at times, slurred speech Hx HTN, DM EXAM: CT HEAD WITHOUT CONTRAST TECHNIQUE: Contiguous axial images were obtained from the base of the skull through the vertex without intravenous contrast. COMPARISON:  None. FINDINGS: No acute intracranial hemorrhage. No focal mass lesion. No CT evidence of acute infarction. No midline shift or mass effect. No hydrocephalus. Basilar cisterns are patent. Paranasal sinuses and mastoid air cells are clear. Orbits are clear. IMPRESSION: No acute intracranial findings.  Normal head CT for age. Electronically Signed   By: Genevive Bi M.D.   On: 12/07/2015 12:33    EKG: Independently reviewed. Sinus bradycardia Borderline intraventricular conduction delay Minimal ST depression, inferior leads  Assessment/Plan Principal Problem:   Dizziness Active Problems:   Hypertension   Diabetes mellitus without complication (HCC)   AKI (acute kidney injury) (HCC)   Ataxia   Bradycardia   #1. Dizziness/ataxia. Etiology unclear concern for TIA. Risk factors include hypertension diabetes hyperlipidemia. Patient reports same symptoms in the past having a workup at [facility]. Symptoms resolved at point of admission. CT of the head without acute abnormality. EKG with sinus bradycardia at 45. Troponin negative Case discussed with neurology per [facility] med center M.D. Neurology recommended no code stroke given current data recommended transfer to code and agreed to consult -Admit to telemetry -Obtain an MRI of the brain -With workup pending results -Hold altering medications -Serum glucose management -Lipid panel A1c -Continue home aspirin and statin -Await neurology input  #2. Acute kidney injury. Likely related to decreased oral intake. Mild. Creatinine 1.21. -Gentle IV fluids -Hold nephrotoxins -Monitor urine output -Recheck in the morning  #3. Sinus bradycardia. Etiology  unclear. Initial troponin negative. home medications to include a beta blocker. Recent TSH within the limits of normal -Monitor on telemetry -Hold beta blocker -Repeat EKG in the a.m. -Cycle troponin   #4. Diabetes. Fair control. Serum glucose 135 on admission. Meds include metformin -Obtain hemoglobin A1c -Sliding scale insulin for optimal control -Hold metformin for now secondary to #2  #5. Hypertension. Fair control in the emergency department. Home medications include atenolol -Hold atenolol for now as stated above -Monitor blood pressure closely -When necessary  hydralazine   DVT prophylaxis: scd Code Status: full  Family Communication: none  Disposition Plan: home hopefully tomorrow  Consults called: neuro  Admission status: obs    Toya Smothers M MD [facility]  If 7PM-7AM, please contact night-coverage [email or web address] Password [email or web address]  12/07/2015, 5:20 PM

## 2015-12-08 ENCOUNTER — Observation Stay (HOSPITAL_BASED_OUTPATIENT_CLINIC_OR_DEPARTMENT_OTHER): Payer: Medicare Other

## 2015-12-08 ENCOUNTER — Observation Stay (HOSPITAL_COMMUNITY): Payer: Medicare Other

## 2015-12-08 DIAGNOSIS — I10 Essential (primary) hypertension: Secondary | ICD-10-CM | POA: Diagnosis present

## 2015-12-08 DIAGNOSIS — E119 Type 2 diabetes mellitus without complications: Secondary | ICD-10-CM | POA: Diagnosis present

## 2015-12-08 DIAGNOSIS — R270 Ataxia, unspecified: Secondary | ICD-10-CM | POA: Diagnosis present

## 2015-12-08 DIAGNOSIS — R42 Dizziness and giddiness: Secondary | ICD-10-CM | POA: Diagnosis present

## 2015-12-08 DIAGNOSIS — Z6825 Body mass index (BMI) 25.0-25.9, adult: Secondary | ICD-10-CM | POA: Diagnosis not present

## 2015-12-08 DIAGNOSIS — G43909 Migraine, unspecified, not intractable, without status migrainosus: Secondary | ICD-10-CM | POA: Diagnosis present

## 2015-12-08 DIAGNOSIS — Z7982 Long term (current) use of aspirin: Secondary | ICD-10-CM | POA: Diagnosis not present

## 2015-12-08 DIAGNOSIS — F329 Major depressive disorder, single episode, unspecified: Secondary | ICD-10-CM | POA: Diagnosis present

## 2015-12-08 DIAGNOSIS — G459 Transient cerebral ischemic attack, unspecified: Secondary | ICD-10-CM

## 2015-12-08 DIAGNOSIS — E785 Hyperlipidemia, unspecified: Secondary | ICD-10-CM | POA: Diagnosis present

## 2015-12-08 DIAGNOSIS — R4781 Slurred speech: Secondary | ICD-10-CM | POA: Diagnosis present

## 2015-12-08 DIAGNOSIS — Z7984 Long term (current) use of oral hypoglycemic drugs: Secondary | ICD-10-CM | POA: Diagnosis not present

## 2015-12-08 DIAGNOSIS — Z79899 Other long term (current) drug therapy: Secondary | ICD-10-CM | POA: Diagnosis not present

## 2015-12-08 DIAGNOSIS — R001 Bradycardia, unspecified: Secondary | ICD-10-CM | POA: Diagnosis present

## 2015-12-08 DIAGNOSIS — Z882 Allergy status to sulfonamides status: Secondary | ICD-10-CM | POA: Diagnosis not present

## 2015-12-08 DIAGNOSIS — Z888 Allergy status to other drugs, medicaments and biological substances status: Secondary | ICD-10-CM | POA: Diagnosis not present

## 2015-12-08 DIAGNOSIS — N179 Acute kidney failure, unspecified: Secondary | ICD-10-CM

## 2015-12-08 DIAGNOSIS — R29701 NIHSS score 1: Secondary | ICD-10-CM | POA: Diagnosis present

## 2015-12-08 LAB — ECHOCARDIOGRAM COMPLETE
Height: 69 in
Weight: 2720 oz

## 2015-12-08 LAB — BASIC METABOLIC PANEL
ANION GAP: 8 (ref 5–15)
BUN: 22 mg/dL — ABNORMAL HIGH (ref 6–20)
CHLORIDE: 105 mmol/L (ref 101–111)
CO2: 28 mmol/L (ref 22–32)
Calcium: 8.8 mg/dL — ABNORMAL LOW (ref 8.9–10.3)
Creatinine, Ser: 1.11 mg/dL (ref 0.61–1.24)
GFR calc non Af Amer: >60 mL/min (ref >60)
Glucose, Bld: 102 mg/dL — ABNORMAL HIGH (ref 65–99)
Potassium: 3.8 mmol/L (ref 3.5–5.1)
Sodium: 141 mmol/L (ref 135–145)

## 2015-12-08 LAB — GLUCOSE, CAPILLARY
GLUCOSE-CAPILLARY: 123 mg/dL — AB (ref 65–99)
GLUCOSE-CAPILLARY: 135 mg/dL — AB (ref 65–99)
GLUCOSE-CAPILLARY: 102 mg/dL — AB (ref 65–99)
GLUCOSE-CAPILLARY: 130 mg/dL — AB (ref 65–99)

## 2015-12-08 LAB — LIPID PANEL
CHOL/HDL RATIO: 4.3 ratio
CHOLESTEROL: 147 mg/dL (ref 0–200)
HDL: 34 mg/dL — AB (ref >40)
LDL Cholesterol: 79 mg/dL (ref 0–99)
TRIGLYCERIDES: 170 mg/dL — AB (ref <150)
VLDL: 34 mg/dL (ref 0–40)

## 2015-12-08 LAB — CBC
HCT: 35.8 % — ABNORMAL LOW (ref 39.0–52.0)
HEMOGLOBIN: 11.6 g/dL — AB (ref 13.0–17.0)
MCH: 30 pg (ref 26.0–34.0)
MCHC: 32.4 g/dL (ref 30.0–36.0)
MCV: 92.5 fL (ref 78.0–100.0)
Platelets: 137 10*3/uL — ABNORMAL LOW (ref 150–400)
RBC: 3.87 MIL/uL — AB (ref 4.22–5.81)
RDW: 12.9 % (ref 11.5–15.5)
WBC: 6.5 10*3/uL (ref 4.0–10.5)

## 2015-12-08 MED ORDER — PRAVASTATIN SODIUM 40 MG PO TABS
40.0000 mg | ORAL_TABLET | Freq: Every day | ORAL | Status: DC
  Administered 2015-12-09: 40 mg via ORAL
  Filled 2015-12-08: qty 1

## 2015-12-08 MED ORDER — GADOBENATE DIMEGLUMINE 529 MG/ML IV SOLN
17.0000 mL | Freq: Once | INTRAVENOUS | Status: AC | PRN
  Administered 2015-12-08: 17 mL via INTRAVENOUS

## 2015-12-08 MED ORDER — ASPIRIN 325 MG PO TABS
325.0000 mg | ORAL_TABLET | Freq: Every day | ORAL | Status: DC
  Administered 2015-12-08 – 2015-12-09 (×2): 325 mg via ORAL
  Filled 2015-12-08 (×2): qty 1

## 2015-12-08 NOTE — Progress Notes (Signed)
Patient ID: Nathaniel Davis Nathaniel Davis, male   DOB: Aug 28, 1945, 70 y.o.   MRN: [id number]  PROGRESS NOTE    Nathaniel Davis  BMW:[id number] DOB: Aug 28, 1945 DOA: 12/07/2015  PCP: Cheral BayHAWKS,ALDENE N, MD   Brief Narrative:  70 y.o. male with medical history significant for diabetes, hypertension, hyperlipidemia who [facility] with dizziness, weakness and questionable slurred speech per pt. He [facility] for completion of stroke work up.  CT head and MRI with no acute CVA identified.    Assessment & Plan:   Principal Problem:   Dizziness - Stroke work up initiated - Seen by neurology in consultation - CT head and MRI wit no evidence of stroke - Continue aspirin daily, plavix daily  - Continue statin therapy  - Awaiting 2 D ECHO results - Pt reports he feels better - If ECHO okay he may possibly go home today   Active Problems:   Diabetes mellitus without complication without long term insulin (HCC) - At home on metformin    Depression - Continue Zoloft     AKI - Likely prerenal  - Subsequently normalized     DVT prophylaxis: Lovenox suBQ Code Status: full code  Family Communication: no family at the bedside this am Disposition Plan: home possibly today if stroke    Consultants:   Neurology   Procedures:  2 D ECHO  Antimicrobials:   None    Subjective: No overnight events.  Objective: Vitals:   12/07/15 1412 12/07/15 1516 12/07/15 2045 12/08/15 0620  BP:  (!) 168/70 (!) 157/76 (!) 157/77  Pulse: (!) 48 (!) 50 (!) 50 (!) 45  Resp: 13 18 18 18   Temp:  98.8 F (37.1 C) 97.9 F (36.6 C) 97.7 F (36.5 C)  TempSrc:  Oral Oral Oral  SpO2: 99% 99% 95% 96%  Weight:  77.1 kg (170 lb)    Height:  5\' 9"  (1.753 m)      Intake/Output Summary (Last 24 hours) at 12/08/15 1120 Last data filed at 12/08/15 0900  Gross per 24 hour  Intake             1330 ml  Output             1050 ml  Net              280 ml   Filed Weights   12/07/15 1041 12/07/15 1516    Weight: 79.4 kg (175 lb) 77.1 kg (170 lb)    Examination:  General exam: Appears calm and comfortable  Respiratory system: Clear to auscultation. Respiratory effort normal. Cardiovascular system: S1 & S2 heard, RRR. No JVD Gastrointestinal system: Abdomen is nondistended, soft and nontender. No organomegaly or masses felt. Normal bowel sounds heard. Central nervous system: Alert and oriented. No focal neurological deficits. Extremities: Symmetric 5 x 5 power. Skin: No rashes, lesions or ulcers Psychiatry: Judgement and insight appear normal. Mood & affect appropriate.   Data Reviewed: I have personally reviewed following labs and imaging studies  CBC:  Recent Labs Lab 12/07/15 1145 12/07/15 1527 12/08/15 0536  WBC 6.7 7.6 6.5  NEUTROABS 4.2  --   --   HGB 12.5* 12.8* 11.6*  HCT 37.7* 39.5 35.8*  MCV 93.5 94.0 92.5  PLT 148* 170 137*   Basic Metabolic Panel:  Recent Labs Lab 12/07/15 1145 12/07/15 1527 12/08/15 0536  NA 139  --  141  K 4.6  --  3.8  CL 104  --  105  CO2 28  --  28  GLUCOSE 135*  --  102*  BUN 28*  --  22*  CREATININE 1.31* 1.22 1.11  CALCIUM 8.9  --  8.8*   GFR: Estimated Creatinine Clearance: 61.9 mL/min (by C-G formula based on SCr of 1.11 mg/dL). Liver Function Tests:  Recent Labs Lab 12/07/15 1145  AST 26  ALT 24  ALKPHOS 67  BILITOT 0.5  PROT 6.9  ALBUMIN 4.2   No results for input(s): LIPASE, AMYLASE in the last 168 hours. No results for input(s): AMMONIA in the last 168 hours. Coagulation Profile:  Recent Labs Lab 12/07/15 1145  INR 1.10   Cardiac Enzymes:  Recent Labs Lab 12/07/15 1145 12/07/15 2000  TROPONINI <0.03 <0.03   BNP (last 3 results) No results for input(s): PROBNP in the last 8760 hours. HbA1C: No results for input(s): HGBA1C in the last 72 hours. CBG:  Recent Labs Lab 12/07/15 1621 12/07/15 2140 12/08/15 0520  GLUCAP 126* 126* 123*   Lipid Profile:  Recent Labs  12/08/15 0536  CHOL  147  HDL 34*  LDLCALC 79  TRIG 756*  CHOLHDL 4.3   Thyroid Function Tests:  Recent Labs  12/07/15 1527  TSH 0.945   Anemia Panel: No results for input(s): VITAMINB12, FOLATE, FERRITIN, TIBC, IRON, RETICCTPCT in the last 72 hours. Urine analysis:    Component Value Date/Time   COLORURINE YELLOW 12/07/2015 1330   APPEARANCEUR CLEAR 12/07/2015 1330   LABSPEC 1.021 12/07/2015 1330   PHURINE 6.5 12/07/2015 1330   GLUCOSEU NEGATIVE 12/07/2015 1330   HGBUR NEGATIVE 12/07/2015 1330   BILIRUBINUR NEGATIVE 12/07/2015 1330   KETONESUR NEGATIVE 12/07/2015 1330   PROTEINUR NEGATIVE 12/07/2015 1330   NITRITE NEGATIVE 12/07/2015 1330   LEUKOCYTESUR NEGATIVE 12/07/2015 1330   Sepsis Labs: (procalcitonin:4,lacticidven:4)   )No results found for this or any previous visit (from the past 240 hour(s)).    Radiology Studies: Ct Head Wo Contrast Result Date: 12/07/2015 CLINICAL DATA: No acute intracranial findings.  Normal head CT for age.   Mr Brain Wo Contrast Result Date: 12/07/2015 CLINICAL DATA:  1. Normal MRI appearance the brain for age. No acute or focal lesion to explain the patient's episodes of dizziness, nausea/vomiting, or slurred speech. Electronically Signed   By: Marin Roberts M.D.   On: 12/07/2015 19:28    Scheduled Meds: . aspirin  325 mg Oral Daily  . benzonatate  100 mg Oral Q8H  . clopidogrel  75 mg Oral Daily  . doxepin  100 mg Oral QHS  . enoxaparin (LOVENOX) injection  40 mg Subcutaneous Q24H  . insulin aspart  0-5 Units Subcutaneous QHS  . insulin aspart  0-9 Units Subcutaneous TID WC  . pantoprazole  40 mg Oral Daily  . pravastatin  10 mg Oral Daily  . sertraline  100 mg Oral Daily   Continuous Infusions: . sodium chloride 50 mL/hr at 12/07/15 1900     LOS: 0 days    Time spent: 15 minutes  Greater than 50% of the time spent on counseling and coordinating the care.   Manson Passey, MD [facility] Pager [phone]  If  7PM-7AM, please contact night-coverage [email or web address] Password [email or web address] 12/08/2015, 11:20 AM

## 2015-12-08 NOTE — Progress Notes (Signed)
  Echocardiogram 2D Echocardiogram has been performed.  Nathaniel Davis 12/08/2015, 12:09 PM

## 2015-12-08 NOTE — Progress Notes (Signed)
STROKE TEAM PROGRESS NOTE   HISTORY OF PRESENT ILLNESS (per record) Nathaniel Davis is a 70 y.o. male with a history of diabetes, hypertension who presents with episode of slurred speech, vertigo and ataxia. He states that he had his first episode a few months ago, this was treated as TIA and he was hospitalized at [facility]. He then had another episode about 2 weeks ago. He describes that each episode begins with eye pain and then he notices that he is unsteady and becomes acutely nauseous. He did not vomit with the one today, but did have some nausea.    SUBJECTIVE (INTERVAL HISTORY) No acute issues overnight. Back to baseline   OBJECTIVE Temp:  [97.7 F (36.5 C)-99 F (37.2 C)] 97.7 F (36.5 C) (08/06 0620) Pulse Rate:  [45-52] 45 (08/06 0620) Cardiac Rhythm: Sinus bradycardia (08/06 0700) Resp:  [13-21] 18 (08/06 0620) BP: (133-197)/(70-100) 157/77 (08/06 0620) SpO2:  [95 %-99 %] 96 % (08/06 0620) Weight:  [77.1 kg (170 lb)-79.4 kg (175 lb)] 77.1 kg (170 lb) (08/05 1516)  CBC:   Recent Labs Lab 12/07/15 1145 12/07/15 1527 12/08/15 0536  WBC 6.7 7.6 6.5  NEUTROABS 4.2  --   --   HGB 12.5* 12.8* 11.6*  HCT 37.7* 39.5 35.8*  MCV 93.5 94.0 92.5  PLT 148* 170 137*    Basic Metabolic Panel:   Recent Labs Lab 12/07/15 1145 12/07/15 1527 12/08/15 0536  NA 139  --  141  K 4.6  --  3.8  CL 104  --  105  CO2 28  --  28  GLUCOSE 135*  --  102*  BUN 28*  --  22*  CREATININE 1.31* 1.22 1.11  CALCIUM 8.9  --  8.8*    Lipid Panel:     Component Value Date/Time   CHOL 147 12/08/2015 0536   TRIG 170 (H) 12/08/2015 0536   HDL 34 (L) 12/08/2015 0536   CHOLHDL 4.3 12/08/2015 0536   VLDL 34 12/08/2015 0536   LDLCALC 79 12/08/2015 0536   HgbA1c: No results found for: HGBA1C Urine Drug Screen:     Component Value Date/Time   LABOPIA NONE DETECTED 12/07/2015 1330   COCAINSCRNUR NONE DETECTED 12/07/2015 1330   LABBENZ POSITIVE (A) 12/07/2015 1330   AMPHETMU  NONE DETECTED 12/07/2015 1330   THCU NONE DETECTED 12/07/2015 1330   LABBARB NONE DETECTED 12/07/2015 1330      IMAGING  Ct Head Wo Contrast 12/07/2015 No acute intracranial findings.  Normal head CT for age.     Mr Brain Wo Contrast  12/07/2015 Normal MRI appearance the brain for age. No acute or focal lesion to explain the patient's episodes of dizziness, nausea/vomiting, or slurred speech.       PHYSICAL EXAM Physical Exam  Constitutional: Appears well-developed and well-nourished.  Psych: Affect appropriate to situation Eyes: No scleral injection HENT: No OP obstrucion Head: Normocephalic.  Cardiovascular: Normal rate and regular rhythm.  Respiratory: Effort normal and breath sounds normal to anterior ascultation GI: Soft.  No distension. There is no tenderness.  Skin: WDI  Neuro: Mental Status: Patient is awake, alert, oriented to person, place, month, year, and situation. Patient is able to give a clear and coherent history. No signs of aphasia or neglect Cranial Nerves: II: Visual Fields are full. Pupils are equal, round, and reactive to light.   III,IV, VI: EOMI without ptosis or diploplia.  V: Facial sensation is symmetric to temperature VII: Facial movement is symmetric.  VIII: hearing  is intact to voice X: Uvula elevates symmetrically XI: Shoulder shrug is symmetric. XII: tongue is midline without atrophy or fasciculations.  Motor: Tone is normal. Bulk is normal. 5/5 strength was present in all four extremities.  Sensory: Sensation is symmetric to light touch and temperature in the arms and legs. Cerebellar: FNF and HKS are intact bilaterally      ASSESSMENT/PLAN [name] is a 70 y.o. male with hypertension, diabetes mellitus, acute kidney injury, and suspected TIAs presents with recurrent TIA like symptoms including slurred speech, vertigo, ataxia, and eye pain.  Possible TIA:    Resultant resolution of deficits  MRI - Normal MRI  appearance the brain for age  MRA  - pending  Carotid Doppler - refer to MRA of the neck which is pending  2D Echo - pending  LDL - 79  HgbA1c pending  VTE prophylaxis - Lovenox Diet heart healthy/carb modified Room service appropriate? Yes; Fluid consistency: Thin  aspirin 81 mg daily prior to admission, now on aspirin 325 mg daily and clopidogrel 75 mg daily  Patient counseled to be compliant with his antithrombotic medications  Ongoing aggressive stroke risk factor management  Therapy recommendations:  Pending  Disposition:  Pending   Hypertension  Stable  Permissive hypertension (OK if < 220/120) but gradually normalize in 5-7 days  Long-term BP goal normotensive  Hyperlipidemia  Home meds: Pravachol 20 mg daily prior to admission - resumed in hospital at 10 mg daily  LDL 79, goal < 70  Increase Pravachol to 40 mg daily  Continue statin at discharge  Diabetes  HgbA1c pending, goal < 7.0  Controlled  Other Stroke Risk Factors  Advanced age  ETOH use, advised to drink no more than 1 - 2 drink(s) a day  Hx stroke/TIA     Other Active Problems  Renal insufficiency - BUN 22 ; creatinine 1.11  Mild anemia  Hospital day # 0  - Would continue dual antiplatelet therapy for 90 days and then transition to plavix 75mg  daily. Discussed with patient and he is in agreement. Will follow up stroke workup.    To contact Stroke Continuity provider, please refer to [email or web address]. After hours, contact General Neurology

## 2015-12-08 NOTE — Progress Notes (Signed)
Report received in patient's room using SBAR format, reviewed orders,labs, VS, meds and patient's general condition, assumed care of patient. 

## 2015-12-09 DIAGNOSIS — I10 Essential (primary) hypertension: Secondary | ICD-10-CM

## 2015-12-09 DIAGNOSIS — R42 Dizziness and giddiness: Secondary | ICD-10-CM

## 2015-12-09 DIAGNOSIS — E785 Hyperlipidemia, unspecified: Secondary | ICD-10-CM

## 2015-12-09 LAB — GLUCOSE, CAPILLARY
GLUCOSE-CAPILLARY: 114 mg/dL — AB (ref 65–99)
Glucose-Capillary: 123 mg/dL — ABNORMAL HIGH (ref 65–99)

## 2015-12-09 LAB — HEMOGLOBIN A1C
HEMOGLOBIN A1C: 6.2 % — AB (ref 4.8–5.6)
Mean Plasma Glucose: 131 mg/dL

## 2015-12-09 MED ORDER — PRAVASTATIN SODIUM 40 MG PO TABS: 40.0000 mg | ORAL_TABLET | Freq: Every day | ORAL | 0 refills | Status: AC

## 2015-12-09 MED ORDER — CLOPIDOGREL BISULFATE 75 MG PO TABS: 75.0000 mg | ORAL_TABLET | Freq: Every day | ORAL | 0 refills | Status: AC

## 2015-12-09 MED ORDER — CARBAMAZEPINE 200 MG PO TABS
400.0000 mg | ORAL_TABLET | Freq: Three times a day (TID) | ORAL | Status: DC
  Administered 2015-12-09: 400 mg via ORAL
  Filled 2015-12-09 (×2): qty 2

## 2015-12-09 MED ORDER — DIVALPROEX SODIUM ER 500 MG PO TB24: 500.0000 mg | ORAL_TABLET | Freq: Every day | ORAL | Status: DC

## 2015-12-09 NOTE — Discharge Instructions (Signed)
Aspirin and Your Heart  Aspirin is a medicine that affects the way blood clots. Aspirin can be used to help reduce the risk of blood clots, heart attacks, and other heart-related problems.  SHOULD I TAKE ASPIRIN? Your health care provider will help you determine whether it is safe and beneficial for you to take aspirin daily. Taking aspirin daily may be beneficial if you:  Have had a heart attack or chest pain.  Have undergone open heart surgery such as coronary artery bypass surgery (CABG).  Have had coronary angioplasty.  Have experienced a stroke or transient ischemic attack (TIA).  Have peripheral vascular disease (PVD).  Have chronic heart rhythm problems such as atrial fibrillation. ARE THERE ANY RISKS OF TAKING ASPIRIN DAILY? Daily use of aspirin can increase your risk of side effects. Some of these include:  Bleeding. Bleeding problems can be minor or serious. An example of a minor problem is a cut that does not stop bleeding. An example of a more serious problem is stomach bleeding or bleeding into the brain. Your risk of bleeding is increased if you are also taking non-steroidal anti-inflammatory medicine (NSAIDs).  Increased bruising.  Upset stomach.  An allergic reaction. People who have nasal polyps have an increased risk of developing an aspirin allergy. WHAT ARE SOME GUIDELINES I SHOULD FOLLOW WHEN TAKING ASPIRIN?   Take aspirin only as directed by your health care provider. Make sure you understand how much you should take and what form you should take. The two forms of aspirin are:  Non-enteric-coated. This type of aspirin does not have a coating and is absorbed quickly. Non-enteric-coated aspirin is usually recommended for people with chest pain. This type of aspirin also comes in a chewable form.  Enteric-coated. This type of aspirin has a special coating that releases the medicine very slowly. Enteric-coated aspirin causes less stomach upset than non-enteric-coated  aspirin. This type of aspirin should not be chewed or crushed.  Drink alcohol in moderation. Drinking alcohol increases your risk of bleeding. WHEN SHOULD I SEEK MEDICAL CARE?   You have unusual bleeding or bruising.  You have stomach pain.  You have an allergic reaction. Symptoms of an allergic reaction include:  Hives.  Itchy skin.  Swelling of the lips, tongue, or face.  You have ringing in your ears. WHEN SHOULD I SEEK IMMEDIATE MEDICAL CARE?   Your bowel movements are bloody, dark red, or black in color.  You vomit or cough up blood.  You have blood in your urine.  You cough, wheeze, or feel short of breath. If you have any of the following symptoms, this is an emergency. Do not wait to see if the pain will go away. Get medical help at once. Call your local emergency services (911 in the [place] Do not drive yourself to the hospital.  You have severe chest pain, especially if the pain is crushing or pressure-like and spreads to the arms, back, neck, or jaw.  You have stroke-like symptoms, such as:   Loss of vision.   Difficulty talking.   Numbness or weakness on one side of your body.   Numbness or weakness in your arm or leg.   Not thinking clearly or feeling confused.    This information is not intended to replace advice given to you by your health care provider. Make sure you discuss any questions you have with your health care provider.   Document Released: 04/02/2008 Document Revised: 05/11/2014 Document Reviewed: 07/26/2013 Elsevier Interactive Patient Education 2016 Elsevier  Inc. Clopidogrel tablets What is this medicine? CLOPIDOGREL (kloh PID oh grel) helps to prevent blood clots. This medicine is used to prevent heart attack, stroke, or other vascular events in people who are at high risk. This medicine may be used for other purposes; ask your health care provider or pharmacist if you have questions. What should I tell my health care provider  before I take this medicine? They need to know if you have any of the following conditions: -bleeding disorder -bleeding in the brain -planned surgery -stomach or intestinal ulcers -stroke or transient ischemic attack -an unusual or allergic reaction to clopidogrel, other medicines, foods, dyes, or preservatives -pregnant or trying to get pregnant -breast-feeding How should I use this medicine? Take this medicine by mouth with a drink of water. Follow the directions on the prescription label. You may take this medicine with or without food. Take your medicine at regular intervals. Do not take your medicine more often than directed. Talk to your pediatrician regarding the use of this medicine in children. Special care may be needed. Overdosage: If you think you have taken too much of this medicine contact a poison control center or emergency room at once. NOTE: This medicine is only for you. Do not share this medicine with others. What if I miss a dose? If you miss a dose, take it as soon as you can. If it is almost time for your next dose, take only that dose. Do not take double or extra doses. What may interact with this medicine? -aspirin -blood thinners like cilostazol, enoxaparin, ticlopidine, and warfarin -certain medicines for depression like citalopram, fluoxetine, and fluvoxamine -certain medicines for fungal infections like ketoconazole, fluconazole, and voriconazole -certain medicines for HIV infection like delavirdine, efavirenz, and etravirine -certain medicines for seizures like felbamate, oxcarbazepine, and phenytoin -chloramphenicol -fluvastatin -isoniazid, INH -medicines for inflammation like ibuprofen and naproxen -modafinil -nicardipine -over-the counter supplements like echinacea, feverfew, fish oil, garlic, ginger, ginkgo, green tea, horse chestnut -quinine -stomach acid blockers like cimetidine, omeprazole, and  esomeprazole -tamoxifen -tolbutamide -topiramate -torsemide This list may not describe all possible interactions. Give your health care provider a list of all the medicines, herbs, non-prescription drugs, or dietary supplements you use. Also tell them if you smoke, drink alcohol, or use illegal drugs. Some items may interact with your medicine. What should I watch for while using this medicine? Visit your doctor or health care professional for regular check ups. Do not stop taking your medicine unless your doctor tells you to. Notify your doctor or health care professional and seek emergency treatment if you develop breathing problems; changes in vision; chest pain; severe, sudden headache; pain, swelling, warmth in the leg; trouble speaking; sudden numbness or weakness of the face, arm or leg. These can be signs that your condition has gotten worse. If you are going to have surgery or dental work, tell your doctor or health care professional that you are taking this medicine. Certain genetic factors may reduce the effect of this medicine. Your doctor may use genetic tests to determine treatment. What side effects may I notice from receiving this medicine? Side effects that you should report to your doctor or health care professional as soon as possible: -allergic reactions like skin rash, itching or hives, swelling of the face, lips, or tongue -breathing problems -changes in vision -fever -signs and symptoms of bleeding such as bloody or black, tarry stools; red or dark-brown urine; spitting up blood or brown material that looks like coffee grounds; red  spots on the skin; unusual bruising or bleeding from the eye, gums, or nose -sudden weakness -unusual bleeding or bruising Side effects that usually do not require medical attention (report to your doctor or health care professional if they continue or are bothersome): -constipation or diarrhea -headache -pain in back or joints -stomach  upset This list may not describe all possible side effects. Call your doctor for medical advice about side effects. You may report side effects to FDA at 1-800-FDA-1088. Where should I keep my medicine? Keep out of the reach of children. Store at room temperature of 59 to 86 degrees F (15 to 30 degrees C). Throw away any unused medicine after the expiration date. NOTE: This sheet is a summary. It may not cover all possible information. If you have questions about this medicine, talk to your doctor, pharmacist, or health care provider.    2016, Elsevier/Gold Standard. (2012-08-16 16:34:37)

## 2015-12-09 NOTE — Progress Notes (Signed)
Pt has orders to be discharged. Discharge instructions given and pt has no additional questions at this time. Medication regimen reviewed and pt educated. Pt verbalized understanding and has no additional questions. Telemetry box removed. IV removed and site in good condition. Pt stable and waiting for transportation.   Gerre Ranum RN 

## 2015-12-09 NOTE — Progress Notes (Signed)
STROKE TEAM PROGRESS NOTE   HISTORY OF PRESENT ILLNESS (per record) Nathaniel Davis a 70 y.o.malewith a history of diabetes, hypertension who presents with episode of slurred speech, vertigo and ataxia. He states that he had his first episode a few months ago, this was treated as TIA and he was hospitalized at [facility]. He then had another episode about 2 weeks ago. He describes that each episode begins with eye pain and then he notices that he is unsteady and becomes acutely nauseous. He did not vomit with the one today, but did have some nausea.   The morning of admission he was with his daughter and began to sway, and slurred his speech. He had a BP checked  while symptomatic which was 190 systolic. He has history of migraines, but did not have any aura with his migraines up until he began having these episodes 3 weeks ago. He estimates that he has had 3 or 4 episodes total. He still has some left retro-orbital headache. He is currently back to his baseline (No LKW). Patient was not administered IV t-PA secondary to resolved symptoms. He was admitted for further evaluation and treatment.   SUBJECTIVE (INTERVAL HISTORY) He is sitting up in a chair in the room. He is very knowledgeable about his history and shared it with Dr. Pearlean Brownie. Overall he feels his condition is stable. He reports his mother died 3 months ago. He reports his wife is quite ill. He feels he is handling stress well.   OBJECTIVE Temp:  [97.9 F (36.6 C)-98.2 F (36.8 C)] 97.9 F (36.6 C) (08/07 0503) Pulse Rate:  [53-59] 59 (08/07 0503) Cardiac Rhythm: Normal sinus rhythm;Heart block (08/07 0745) Resp:  [16-18] 17 (08/07 0503) BP: (151-170)/(76-100) 151/100 (08/07 0503) SpO2:  [97 %-100 %] 98 % (08/07 0503) Weight:  [76.1 kg (167 lb 12.8 oz)] 76.1 kg (167 lb 12.8 oz) (08/07 0503)  CBC:  Recent Labs Lab 12/07/15 1145 12/07/15 1527 12/08/15 0536  WBC 6.7 7.6 6.5  NEUTROABS 4.2  --   --   HGB 12.5* 12.8*  11.6*  HCT 37.7* 39.5 35.8*  MCV 93.5 94.0 92.5  PLT 148* 170 137*    Basic Metabolic Panel:  Recent Labs Lab 12/07/15 1145 12/07/15 1527 12/08/15 0536  NA 139  --  141  K 4.6  --  3.8  CL 104  --  105  CO2 28  --  28  GLUCOSE 135*  --  102*  BUN 28*  --  22*  CREATININE 1.31* 1.22 1.11  CALCIUM 8.9  --  8.8*    Lipid Panel:    Component Value Date/Time   CHOL 147 12/08/2015 0536   TRIG 170 (H) 12/08/2015 0536   HDL 34 (L) 12/08/2015 0536   CHOLHDL 4.3 12/08/2015 0536   VLDL 34 12/08/2015 0536   LDLCALC 79 12/08/2015 0536   HgbA1c: No results found for: HGBA1C Urine Drug Screen:    Component Value Date/Time   LABOPIA NONE DETECTED 12/07/2015 1330   COCAINSCRNUR NONE DETECTED 12/07/2015 1330   LABBENZ POSITIVE (A) 12/07/2015 1330   AMPHETMU NONE DETECTED 12/07/2015 1330   THCU NONE DETECTED 12/07/2015 1330   LABBARB NONE DETECTED 12/07/2015 1330      IMAGING  Ct Head Wo Contrast  Result Date: 12/07/2015 CLINICAL DATA:  Pt reports dizziness x approx 2wks -- reports it always occurs during activity. Denies chest pain, sob. Reports associated n/v at times, slurred speech Hx HTN, DM EXAM: CT HEAD WITHOUT  CONTRAST TECHNIQUE: Contiguous axial images were obtained from the base of the skull through the vertex without intravenous contrast. COMPARISON:  None. FINDINGS: No acute intracranial hemorrhage. No focal mass lesion. No CT evidence of acute infarction. No midline shift or mass effect. No hydrocephalus. Basilar cisterns are patent. Paranasal sinuses and mastoid air cells are clear. Orbits are clear. IMPRESSION: No acute intracranial findings.  Normal head CT for age. Electronically Signed   By: Genevive Bi M.D.   On: 12/07/2015 12:33   Mr Maxine Glenn Head Wo Contrast  Result Date: 12/08/2015 CLINICAL DATA:  TIA. Episodes of slurred speech, vertigo, and ataxia. EXAM: MRA NECK WITHOUT AND WITH CONTRAST MRA HEAD WITHOUT CONTRAST TECHNIQUE: Multiplanar and multiecho pulse  sequences of the neck were obtained without and with intravenous contrast. Angiographic images of the neck were obtained using MRA technique without and with intravenous contast.; Angiographic images of the Circle of Willis were obtained using MRA technique without intravenous contrast. CONTRAST:  17mL MULTIHANCE GADOBENATE DIMEGLUMINE 529 MG/ML IV SOLN COMPARISON:  None. FINDINGS: MRA NECK FINDINGS Three vessel aortic arch. Brachiocephalic and subclavian arteries are widely patent. The cervical carotid arteries are widely patent bilaterally without evidence of stenosis or dissection. There is minimal irregularity of the proximal right ICA. The vertebral arteries are patent and codominant with antegrade flow bilaterally. No vertebral artery stenosis is identified. MRA HEAD FINDINGS The intracranial vertebral arteries are widely patent to the basilar. Left PICA and right AICA origins are patent. SCA origins are patent. Basilar artery is widely patent. PCAs are unremarkable. Internal carotid arteries are patent from skullbase to carotid termini without stenosis. The left A1 segment is markedly hypoplastic. The right A1 segment is widely patent and provides the dominant supply to the left A2 segment. MCAs are unremarkable. No intracranial aneurysm is identified. IMPRESSION: Negative head and neck MRA. No evidence of significant posterior circulation stenosis. Electronically Signed   By: Sebastian Ache M.D.   On: 12/08/2015 15:21   Mr Angiogram Neck W Wo Contrast  Result Date: 12/08/2015 CLINICAL DATA:  TIA. Episodes of slurred speech, vertigo, and ataxia. EXAM: MRA NECK WITHOUT AND WITH CONTRAST MRA HEAD WITHOUT CONTRAST TECHNIQUE: Multiplanar and multiecho pulse sequences of the neck were obtained without and with intravenous contrast. Angiographic images of the neck were obtained using MRA technique without and with intravenous contast.; Angiographic images of the Circle of Willis were obtained using MRA technique  without intravenous contrast. CONTRAST:  17mL MULTIHANCE GADOBENATE DIMEGLUMINE 529 MG/ML IV SOLN COMPARISON:  None. FINDINGS: MRA NECK FINDINGS Three vessel aortic arch. Brachiocephalic and subclavian arteries are widely patent. The cervical carotid arteries are widely patent bilaterally without evidence of stenosis or dissection. There is minimal irregularity of the proximal right ICA. The vertebral arteries are patent and codominant with antegrade flow bilaterally. No vertebral artery stenosis is identified. MRA HEAD FINDINGS The intracranial vertebral arteries are widely patent to the basilar. Left PICA and right AICA origins are patent. SCA origins are patent. Basilar artery is widely patent. PCAs are unremarkable. Internal carotid arteries are patent from skullbase to carotid termini without stenosis. The left A1 segment is markedly hypoplastic. The right A1 segment is widely patent and provides the dominant supply to the left A2 segment. MCAs are unremarkable. No intracranial aneurysm is identified. IMPRESSION: Negative head and neck MRA. No evidence of significant posterior circulation stenosis. Electronically Signed   By: Sebastian Ache M.D.   On: 12/08/2015 15:21   Mr Brain Wo Contrast  Result Date: 12/07/2015  CLINICAL DATA:  Dizziness with activity over the last 2 weeks. Occasional nausea and vomiting. Occasional slurred speech. EXAM: MRI HEAD WITHOUT CONTRAST TECHNIQUE: Multiplanar, multiecho pulse sequences of the brain and surrounding structures were obtained without intravenous contrast. COMPARISON:  CT head without contrast. FINDINGS: Mild atrophy and white matter changes are within normal limits for age. No acute infarct, hemorrhage, or mass lesion is present. The ventricles are of normal size. No significant extraaxial fluid collection is present. Flow is present in the major intracranial arteries. The globes and orbits are intact. The paranasal sinuses and mastoid air cells are clear. The  skullbase is within normal limits. Midline sagittal images are unremarkable. Mild degenerative changes are present in the upper cervical spine. IMPRESSION: 1. Normal MRI appearance the brain for age. No acute or focal lesion to explain the patient's episodes of dizziness, nausea/vomiting, or slurred speech. Electronically Signed   By: Marin Robertshristopher  Mattern M.D.   On: 12/07/2015 19:28   2D Echocardiogram  - Left ventricle: The cavity size was normal. Wall thickness was normal. Systolic function was normal. The estimated ejection fraction was in the range of 55% to 60%. Wall motion was normal; there were no regional wall motion abnormalities. Doppler parameters are consistent with abnormal left ventricular relaxation (grade 1 diastolic dysfunction). - Aortic valve: There was trivial regurgitation. - Mitral valve: There was mild regurgitation. Impressions:   Normal LV systolic function; grade 1 diastolic dysfunction; trace AI; mild MR.   PHYSICAL EXAM Obese elderly Caucasian male not in distress. . Afebrile. Head is nontraumatic. Neck is supple without bruit.    Cardiac exam no murmur or gallop. Lungs are clear to auscultation. Distal pulses are well felt. Neurological Exam ;  Awake  Alert oriented x 3. Normal speech and language.eye movements full without nystagmus.fundi were not visualized. Vision acuity and fields appear normal. Hearing is normal. Palatal movements are normal. Face symmetric. Tongue midline. Normal strength, tone, reflexes and coordination. Normal sensation. Gait deferred.  ASSESSMENT/PLAN Mr. Wayne Severhomas Fuller is a 70 y.o. male with history of diabetes, hypertension, hyperlipidemia with recurrent episodes of vertigo, nausea, ataxia, slurred speech concerning for posterior circulation TIA. He did not receive IV t-PA due to resolved symptoms.   Atypical Migraine/Migraine Variant  MRI  No acute stroke  MRA head and neck  negative  2D Echo  EF 55-60%. No source of embolus   LDL  79  HgbA1c pending  Lovenox 40 mg sq daily for VTE prophylaxis  Diet heart healthy/carb modified Room service appropriate? Yes; Fluid consistency: Thin  aspirin 81 mg daily prior to admission, now on aspirin 325 mg daily and clopidogrel 75 mg daily. Recommend continuation of plavix at discharge. No additional aspirin recommended, have discontinued.  Add Depakote 500 mg ER for HA management/control  Primary stroke prevention efforts by PCP/neuorology to continue at discharge  Therapy recommendations:  No therapy needs  Disposition:  Return home  Follow[facility]. He can follow up with him.  Nothing further to add from a stroke standpoint. Stroke team will sign off. Please call for questions.  Hypertension  slighly elevated  On lisinopril and Tenormin at home  Long-term BP goal normotensive  Hyperlipidemia  Home meds:  pravachol 20  LDL 79  Continue statin at discharge  Other Stroke Risk Factors  Advanced age  ETOH use, advised to drink no more than 2 drink(s) a day  Migraines  Other Active Problems  Depression  Acute kidney injury  Hospital day #  1  BIBY,SHARON  [facility] See Amion for Pager information 12/09/2015 3:43 PM   I have personally examined this patient, reviewed notes, independently viewed imaging studies, participated in medical decision making and plan of care. I have made any additions or clarifications directly to the above note. Agree with note above. He has had several recurrent stereotypical episodes of eye pain followed by speech difficulties with dizziness and ataxia likely computed migraine episodes given the negative MRI scan of the brain 2. Recommend trial of Depakote ER 500 mg daily for migraine prophylaxis. Greater than 50% time during this 25 minute visit was spent on counseling and coordination of care about stroke TIA and migraine risk and prevention. Follow-up as an outpatient with neurologist  Dr. Hyacinth Meeker in [facility].  Delia Heady, MD Medical Director [facility] Pager: [phone] 12/09/2015 4:50 PM   To contact Stroke Continuity provider, please refer to [email or web address]. After hours, contact General Neurology

## 2015-12-09 NOTE — Discharge Summary (Addendum)
Physician Discharge Summary  Nathaniel Davis ZOX:[id number] DOB: 12/20/1945 DOA: 12/07/2015  PCP: Cheral Bay, MD  Admit date: 12/07/2015 Discharge date: 12/09/2015  Recommendations for Outpatient Follow-up:  Per neurology continue plavix only.  Discharge Diagnoses:  Principal Problem:   Dizziness Active Problems:   Hypertension   Diabetes mellitus without complication (HCC)   AKI (acute kidney injury) (HCC)   Dizzy   Ataxia   Bradycardia    Discharge Condition: stable   Diet recommendation: as tolerated   History of present illness:  70 y.o.malewith medical history significant for diabetes, hypertension, hyperlipidemia who presented to [facility] with dizziness, weakness and questionable slurred speech per pt. He was transferred to [facility] for completion of stroke work up.  CT head and MRI with no acute CVA identified.  Hospital Course:    Assessment & Plan:   Principal Problem:    Dizziness / TIA - Stroke work up initiated - Seen by neurology in consultation - CT head and MRI with no evidence of stroke - MRA with no acute findings - Possible episode of TIA - Continue aspirin daily, plavix daily  - Continue statin therapy  - 2 D ECHO with norma EF - Per neuro, plavix only   Active Problems:   Dyslipidemia associated with type 2 DM - Continue pravastatin    Diabetes mellitus without complication without long term insulin (HCC) - At home on metformin    Depression - Continue Zoloft     AKI - Likely prerenal  - Subsequently normalized     DVT prophylaxis: Lovenox suBQ Code Status: full code  Family Communication: no family at the bedside this am    Consultants:   Neurology   Procedures:  2 D ECHO  Antimicrobials:   None    Signed:  Manson Passey, MD  [facility] 12/09/2015, 2:04 PM  Pager #: [phone]  Time spent in minutes: less than 30 minutes    Discharge Exam: Vitals:   12/08/15 2135 12/09/15 0503  BP: (!)  154/95 (!) 151/100  Pulse: (!) 57 (!) 59  Resp: 16 17  Temp: 98.2 F (36.8 C) 97.9 F (36.6 C)   Vitals:   12/08/15 0620 12/08/15 1225 12/08/15 2135 12/09/15 0503  BP: (!) 157/77 (!) 170/76 (!) 154/95 (!) 151/100  Pulse: (!) 45 (!) 53 (!) 57 (!) 59  Resp: 18 18 16 17   Temp: 97.7 F (36.5 C) 98 F (36.7 C) 98.2 F (36.8 C) 97.9 F (36.6 C)  TempSrc: Oral Oral Oral Oral  SpO2: 96% 100% 97% 98%  Weight:    76.1 kg (167 lb 12.8 oz)  Height:        General: Pt is alert, follows commands appropriately, not in acute distress Cardiovascular: Regular rate and rhythm, S1/S2 +, no murmurs Respiratory: Clear to auscultation bilaterally, no wheezing, no crackles, no rhonchi Abdominal: Soft, non tender, non distended, bowel sounds +, no guarding Extremities: no edema, no cyanosis, pulses palpable bilaterally DP and PT Neuro: Grossly nonfocal  Discharge Instructions  Discharge Instructions    Call MD for:  persistant nausea and vomiting    Complete by:  As directed   Call MD for:  severe uncontrolled pain    Complete by:  As directed   Diet - low sodium heart healthy    Complete by:  As directed   Discharge instructions    Complete by:  As directed   Per neurology continue  plavix only.   Increase activity slowly    Complete by:  As directed       Medication List    STOP taking these medications   aspirin EC 81 MG tablet   benzonatate 100 MG capsule Commonly known as:  TESSALON   cefUROXime 250 MG tablet Commonly known as:  CEFTIN     TAKE these medications   ALPRAZolam 1 MG tablet Commonly known as:  XANAX Take 0.5 mg by mouth See admin instructions. Take 1/2 tablet (0.5 mg) by mouth daily at bedtime, may also take 1/2 tablet 2 times daily as needed for anxiety   atenolol 50 MG tablet Commonly known as:  TENORMIN Take 25 mg by mouth daily.   carbamazepine 200 MG tablet Commonly known as:  TEGRETOL Take 400 mg by mouth 3 (three) times daily.   clobetasol 0.05 %  external solution Commonly known as:  TEMOVATE Apply 1 application topically See admin instructions. Apply to scalp at bedtime as needed for itching   clopidogrel 75 MG tablet Commonly known as:  PLAVIX Take 1 tablet (75 mg total) by mouth daily.   diphenoxylate-atropine 2.5-0.025 MG tablet Commonly known as:  LOMOTIL Take 1 tablet by mouth 2 (two) times daily as needed for diarrhea or loose stools.   doxepin 25 MG capsule Commonly known as:  SINEQUAN Take 100 mg by mouth at bedtime.   HYDROcodone-acetaminophen 10-325 MG tablet Commonly known as:  NORCO Take 1 tablet by mouth every 6 (six) hours as needed (pain).   hydrOXYzine 25 MG tablet Commonly known as:  ATARAX/VISTARIL Take 25 mg by mouth 3 (three) times daily as needed for itching.   indomethacin 50 MG capsule Commonly known as:  INDOCIN Take 50 mg by mouth 3 (three) times daily with meals.   lisinopril 5 MG tablet Commonly known as:  PRINIVIL,ZESTRIL Take 5 mg by mouth daily.   meclizine 12.5 MG tablet Commonly known as:  ANTIVERT Take 12.5 mg by mouth 3 (three) times daily.   metFORMIN 500 MG tablet Commonly known as:  GLUCOPHAGE Take 500 mg by mouth 3 (three) times daily with meals.   pantoprazole 40 MG tablet Commonly known as:  PROTONIX Take 40 mg by mouth daily after supper.   pravastatin 40 MG tablet Commonly known as:  PRAVACHOL Take 1 tablet (40 mg total) by mouth daily. What changed:  medication strength  how much to take  Another medication with the same name was removed. Continue taking this medication, and follow the directions you see here.   promethazine 25 MG tablet Commonly known as:  PHENERGAN Take 25 mg by mouth 2 (two) times daily as needed for nausea or vomiting.   promethazine 25 MG suppository Commonly known as:  PHENERGAN Place 25 mg rectally every 6 (six) hours as needed for nausea or vomiting.   sertraline 100 MG tablet Commonly known as:  ZOLOFT Take 150 mg by mouth  daily.       Follow-up Information    Cheral BayHAWKS,ALDENE N, MD. Go on 12/17/2015.   Specialty:  Family Medicine Why:  @3 :00pm Contact information: [address] [place] [id number] [phone]            The results of significant diagnostics from this hospitalization (including imaging, microbiology, ancillary and laboratory) are listed below for reference.    Significant Diagnostic Studies: Ct Head Wo Contrast  Result Date: 12/07/2015 CLINICAL DATA:  Pt reports dizziness x approx 2wks -- reports it always occurs during activity. Denies chest pain, sob. Reports associated n/v at times, slurred speech Hx HTN,  DM EXAM: CT HEAD WITHOUT CONTRAST TECHNIQUE: Contiguous axial images were obtained from the base of the skull through the vertex without intravenous contrast. COMPARISON:  None. FINDINGS: No acute intracranial hemorrhage. No focal mass lesion. No CT evidence of acute infarction. No midline shift or mass effect. No hydrocephalus. Basilar cisterns are patent. Paranasal sinuses and mastoid air cells are clear. Orbits are clear. IMPRESSION: No acute intracranial findings.  Normal head CT for age. Electronically Signed   By: Genevive Bi M.D.   On: 12/07/2015 12:33   Mr Maxine Glenn Head Wo Contrast  Result Date: 12/08/2015 CLINICAL DATA:  TIA. Episodes of slurred speech, vertigo, and ataxia. EXAM: MRA NECK WITHOUT AND WITH CONTRAST MRA HEAD WITHOUT CONTRAST TECHNIQUE: Multiplanar and multiecho pulse sequences of the neck were obtained without and with intravenous contrast. Angiographic images of the neck were obtained using MRA technique without and with intravenous contast.; Angiographic images of the Circle of Willis were obtained using MRA technique without intravenous contrast. CONTRAST:  17mL MULTIHANCE GADOBENATE DIMEGLUMINE 529 MG/ML IV SOLN COMPARISON:  None. FINDINGS: MRA NECK FINDINGS Three vessel aortic arch. Brachiocephalic and subclavian arteries are widely patent. The cervical  carotid arteries are widely patent bilaterally without evidence of stenosis or dissection. There is minimal irregularity of the proximal right ICA. The vertebral arteries are patent and codominant with antegrade flow bilaterally. No vertebral artery stenosis is identified. MRA HEAD FINDINGS The intracranial vertebral arteries are widely patent to the basilar. Left PICA and right AICA origins are patent. SCA origins are patent. Basilar artery is widely patent. PCAs are unremarkable. Internal carotid arteries are patent from skullbase to carotid termini without stenosis. The left A1 segment is markedly hypoplastic. The right A1 segment is widely patent and provides the dominant supply to the left A2 segment. MCAs are unremarkable. No intracranial aneurysm is identified. IMPRESSION: Negative head and neck MRA. No evidence of significant posterior circulation stenosis. Electronically Signed   By: Sebastian Ache M.D.   On: 12/08/2015 15:21   Mr Angiogram Neck W Wo Contrast  Result Date: 12/08/2015 CLINICAL DATA:  TIA. Episodes of slurred speech, vertigo, and ataxia. EXAM: MRA NECK WITHOUT AND WITH CONTRAST MRA HEAD WITHOUT CONTRAST TECHNIQUE: Multiplanar and multiecho pulse sequences of the neck were obtained without and with intravenous contrast. Angiographic images of the neck were obtained using MRA technique without and with intravenous contast.; Angiographic images of the Circle of Willis were obtained using MRA technique without intravenous contrast. CONTRAST:  17mL MULTIHANCE GADOBENATE DIMEGLUMINE 529 MG/ML IV SOLN COMPARISON:  None. FINDINGS: MRA NECK FINDINGS Three vessel aortic arch. Brachiocephalic and subclavian arteries are widely patent. The cervical carotid arteries are widely patent bilaterally without evidence of stenosis or dissection. There is minimal irregularity of the proximal right ICA. The vertebral arteries are patent and codominant with antegrade flow bilaterally. No vertebral artery stenosis  is identified. MRA HEAD FINDINGS The intracranial vertebral arteries are widely patent to the basilar. Left PICA and right AICA origins are patent. SCA origins are patent. Basilar artery is widely patent. PCAs are unremarkable. Internal carotid arteries are patent from skullbase to carotid termini without stenosis. The left A1 segment is markedly hypoplastic. The right A1 segment is widely patent and provides the dominant supply to the left A2 segment. MCAs are unremarkable. No intracranial aneurysm is identified. IMPRESSION: Negative head and neck MRA. No evidence of significant posterior circulation stenosis. Electronically Signed   By: Sebastian Ache M.D.   On: 12/08/2015 15:21   Mr Brain Ilda Basset  Contrast  Result Date: 12/07/2015 CLINICAL DATA:  Dizziness with activity over the last 2 weeks. Occasional nausea and vomiting. Occasional slurred speech. EXAM: MRI HEAD WITHOUT CONTRAST TECHNIQUE: Multiplanar, multiecho pulse sequences of the brain and surrounding structures were obtained without intravenous contrast. COMPARISON:  CT head without contrast. FINDINGS: Mild atrophy and white matter changes are within normal limits for age. No acute infarct, hemorrhage, or mass lesion is present. The ventricles are of normal size. No significant extraaxial fluid collection is present. Flow is present in the major intracranial arteries. The globes and orbits are intact. The paranasal sinuses and mastoid air cells are clear. The skullbase is within normal limits. Midline sagittal images are unremarkable. Mild degenerative changes are present in the upper cervical spine. IMPRESSION: 1. Normal MRI appearance the brain for age. No acute or focal lesion to explain the patient's episodes of dizziness, nausea/vomiting, or slurred speech. Electronically Signed   By: Marin Roberts M.D.   On: 12/07/2015 19:28    Microbiology: No results found for this or any previous visit (from the past 240 hour(s)).   Labs: Basic  Metabolic Panel:  Recent Labs Lab 12/07/15 1145 12/07/15 1527 12/08/15 0536  NA 139  --  141  K 4.6  --  3.8  CL 104  --  105  CO2 28  --  28  GLUCOSE 135*  --  102*  BUN 28*  --  22*  CREATININE 1.31* 1.22 1.11  CALCIUM 8.9  --  8.8*   Liver Function Tests:  Recent Labs Lab 12/07/15 1145  AST 26  ALT 24  ALKPHOS 67  BILITOT 0.5  PROT 6.9  ALBUMIN 4.2   No results for input(s): LIPASE, AMYLASE in the last 168 hours. No results for input(s): AMMONIA in the last 168 hours. CBC:  Recent Labs Lab 12/07/15 1145 12/07/15 1527 12/08/15 0536  WBC 6.7 7.6 6.5  NEUTROABS 4.2  --   --   HGB 12.5* 12.8* 11.6*  HCT 37.7* 39.5 35.8*  MCV 93.5 94.0 92.5  PLT 148* 170 137*   Cardiac Enzymes:  Recent Labs Lab 12/07/15 1145 12/07/15 2000  TROPONINI <0.03 <0.03   BNP: BNP (last 3 results) No results for input(s): BNP in the last 8760 hours.  ProBNP (last 3 results) No results for input(s): PROBNP in the last 8760 hours.  CBG:  Recent Labs Lab 12/08/15 1127 12/08/15 1621 12/08/15 2134 12/09/15 0639 12/09/15 1146  GLUCAP 135* 102* 130* 114* 123*

## 2015-12-09 NOTE — Care Management Important Message (Signed)
Important Message  Patient Details  Name: Nathaniel Davis MRN: [id number] Date of Birth: [birth year]   Medicare Important Message Given:  Yes    Bernadette HoitShoffner, Arvon Schreiner Coleman 12/09/2015, 8:35 AM

## 2017-08-29 ENCOUNTER — Emergency Department (HOSPITAL_BASED_OUTPATIENT_CLINIC_OR_DEPARTMENT_OTHER)
Admission: EM | Admit: 2017-08-29 | Discharge: 2017-08-29 | Disposition: A | Discharge status: Home / Self Care | Payer: Medicare Other | Attending: Emergency Medicine | Admitting: Emergency Medicine

## 2017-08-29 ENCOUNTER — Encounter (HOSPITAL_BASED_OUTPATIENT_CLINIC_OR_DEPARTMENT_OTHER): Payer: Self-pay | Admitting: Emergency Medicine

## 2017-08-29 ENCOUNTER — Other Ambulatory Visit: Payer: Self-pay

## 2017-08-29 ENCOUNTER — Emergency Department (HOSPITAL_BASED_OUTPATIENT_CLINIC_OR_DEPARTMENT_OTHER): Payer: Medicare Other

## 2017-08-29 DIAGNOSIS — H1033 Unspecified acute conjunctivitis, bilateral: Secondary | ICD-10-CM | POA: Diagnosis not present

## 2017-08-29 DIAGNOSIS — I10 Essential (primary) hypertension: Secondary | ICD-10-CM | POA: Insufficient documentation

## 2017-08-29 DIAGNOSIS — E119 Type 2 diabetes mellitus without complications: Secondary | ICD-10-CM | POA: Diagnosis not present

## 2017-08-29 DIAGNOSIS — Z7902 Long term (current) use of antithrombotics/antiplatelets: Secondary | ICD-10-CM | POA: Insufficient documentation

## 2017-08-29 DIAGNOSIS — B9689 Other specified bacterial agents as the cause of diseases classified elsewhere: Secondary | ICD-10-CM

## 2017-08-29 DIAGNOSIS — Z7984 Long term (current) use of oral hypoglycemic drugs: Secondary | ICD-10-CM | POA: Insufficient documentation

## 2017-08-29 DIAGNOSIS — R05 Cough: Secondary | ICD-10-CM | POA: Diagnosis present

## 2017-08-29 DIAGNOSIS — J019 Acute sinusitis, unspecified: Secondary | ICD-10-CM | POA: Diagnosis not present

## 2017-08-29 MED ORDER — ERYTHROMYCIN 5 MG/GM OP OINT: TOPICAL_OINTMENT | OPHTHALMIC | 0 refills | Status: AC

## 2017-08-29 MED ORDER — AMOXICILLIN-POT CLAVULANATE 875-125 MG PO TABS: 1.0000 | ORAL_TABLET | Freq: Two times a day (BID) | ORAL | 0 refills | Status: DC

## 2017-08-29 NOTE — ED Triage Notes (Signed)
Patient states that he has had a cough and fever earlier this week. The patient reports that it got better on Friday - the patient then reports that he has had drainage that looked like pus to his bilateral eyes

## 2017-08-29 NOTE — Discharge Instructions (Signed)
Take 1 tablet of Augmentin 2 times daily for the next 7 days.  Apply a thin layer, about half inch of erythromycin ointment along the lower eyelid of both eyes every 6 hours while you are awake for the next 5 days.  Take 650 mg of Tylenol once every 6 hours for pain.  Also use a sinus rinse, instructions are attached, to help with nasal drainage and congestion.  Your primary care provider to schedule an appointment for recheck in 3 to 4 days.  It is important that you wash your hands anytime you touch your eyes or face or after coughing to avoid spread of infection.  If you develop new or worsening symptoms including stiffness or the inability to move your neck, high fever despite taking Tylenol, changes in her vision including double vision, shortness of breath, chest pain, or other new concerning symptoms, please return to the emergency department for re-evaluation.

## 2017-08-29 NOTE — ED Provider Notes (Signed)
[facility] EMERGENCY DEPARTMENT Provider Note   CSN: [id number] Arrival date & time: 08/29/17  1114     History   Chief Complaint Chief Complaint  Patient presents with  . Cough    HPI Nathaniel Davis is a 72 y.o. male with a h/o of DM Type II and HTN who presents to the emergency department with a chief complaint of nasal congestion.  The patient reports that he just returned from a flight to [place] [employer] [place] 4 days ago. States that he didn't feel his ears "pop" on the pain. He endorses a non-productive cough, nasal congestion, and rhinorrhea that began 4 days ago.  He states that he had a fever of 101 three days ago that resolved within 24 hours.  Over the last 2 days, he reports purulent drainage from the bilateral eyes.  His eyes were matted shut when he awoke 2 days ago, but not for the last 2 days. He states that yesterday he had swelling of the right eye, and it felt like "a big marble was located under each of his eyes."  Swelling of the right eye improved with warm compresses.  He denies chills, headache, sinus pain or pressure, dizziness, lightheadedness, neck pain or stiffness, back pain, dyspnea, chest pain, abdominal pain, N/V/D, rash, tinnitus, myalgias, or visual changes.   He reports that his bilateral eyes have been red, but are not itchy or painful.  No photophobia.  He reports that his glucose has been well controlled and has been running in the low 100s over the last few days.   The history is provided by the patient. No language interpreter was used.  Cough  Associated symptoms include ear pain and rhinorrhea. Pertinent negatives include no chest pain, no chills, no headaches, no sore throat, no myalgias and no shortness of breath.    Past Medical History:  Diagnosis Date  . AKI (acute kidney injury) (HCC)   . Diabetes mellitus without complication (HCC)   . Hypertension     Patient Active Problem List   Diagnosis Date Noted  . Dizziness 12/07/2015  .  Dizzy 12/07/2015  . Ataxia 12/07/2015  . Bradycardia 12/07/2015  . Hypertension   . Diabetes mellitus without complication (HCC)   . AKI (acute kidney injury) (HCC)     History reviewed. No pertinent surgical history.      Home Medications    Prior to Admission medications   Medication Sig Start Date End Date Taking? Authorizing Provider  ALPRAZolam Prudy Feeler) 1 MG tablet Take 0.5 mg by mouth See admin instructions. Take 1/2 tablet (0.5 mg) by mouth daily at bedtime, may also take 1/2 tablet 2 times daily as needed for anxiety    [provider]  amoxicillin-clavulanate (AUGMENTIN) 875-125 MG tablet Take 1 tablet by mouth every 12 (twelve) hours. 08/29/17   Sevyn Paredez A, PA-C  atenolol (TENORMIN) 50 MG tablet Take 25 mg by mouth daily.     [provider]  carbamazepine (TEGRETOL) 200 MG tablet Take 400 mg by mouth 3 (three) times daily.     [provider]  clobetasol (TEMOVATE) 0.05 % external solution Apply 1 application topically See admin instructions. Apply to scalp at bedtime as needed for itching 09/28/13   [provider]  clopidogrel (PLAVIX) 75 MG tablet Take 1 tablet (75 mg total) by mouth daily. 12/09/15   Alison Murray, MD  diphenoxylate-atropine (LOMOTIL) 2.5-0.025 MG per tablet Take 1 tablet by mouth 2 (two) times daily as needed for  diarrhea or loose stools.     [provider]  doxepin (SINEQUAN) 25 MG capsule Take 100 mg by mouth at bedtime.     [provider]  erythromycin ophthalmic ointment Place a 1/2 inch ribbon of ointment into the left and the right lower eyelids every 6 hours while you are awake for 5 days. 08/29/17   Jaron Czarnecki A, PA-C  HYDROcodone-acetaminophen (NORCO) 10-325 MG per tablet Take 1 tablet by mouth every 6 (six) hours as needed (pain).     [provider]  hydrOXYzine (ATARAX/VISTARIL) 25 MG tablet Take 25 mg by mouth 3 (three) times daily as needed for itching.     [provider]  indomethacin (INDOCIN) 50 MG capsule Take 50 mg by mouth 3 (three) times daily with meals.     [provider]  lisinopril (PRINIVIL,ZESTRIL) 5 MG tablet Take 5 mg by mouth daily. 11/28/15   [provider]  meclizine (ANTIVERT) 12.5 MG tablet Take 12.5 mg by mouth 3 (three) times daily. 11/28/15   [provider]  metFORMIN (GLUCOPHAGE) 500 MG tablet Take 500 mg by mouth 3 (three) times daily with meals.    [provider]  pantoprazole (PROTONIX) 40 MG tablet Take 40 mg by mouth daily after supper.     [provider]  pravastatin (PRAVACHOL) 40 MG tablet Take 1 tablet (40 mg total) by mouth daily. 12/09/15   Alison Murray, MD  promethazine (PHENERGAN) 25 MG suppository Place 25 mg rectally every 6 (six) hours as needed for nausea or vomiting.    [provider]  promethazine (PHENERGAN) 25 MG tablet Take 25 mg by mouth 2 (two) times daily as needed for nausea or vomiting.     [provider]  sertraline (ZOLOFT) 100 MG tablet Take 150 mg by mouth daily.     [provider]    Family History Family History  Problem Relation Age of Onset  . Diabetes Mother   . Hypertension Father     Social History Social History   Tobacco Use  . Smoking status: Never Smoker  . Smokeless tobacco: Never Used  Substance Use Topics  . Alcohol use: Yes    Comment: occasionally monthly  . Drug use: No     Allergies   Crestor [rosuvastatin]; Iodinated diagnostic agents; and Sulfa antibiotics   Review of Systems Review of Systems  Constitutional: Positive for fever. Negative for appetite change and chills.  HENT: Positive for congestion, ear pain and rhinorrhea. Negative for ear discharge, nosebleeds, sinus pressure, sinus pain, sneezing, sore throat, tinnitus and trouble swallowing.   Eyes: Positive for pain. Negative for visual disturbance.  Respiratory: Positive for cough. Negative for shortness of breath.     Cardiovascular: Negative for chest pain.  Gastrointestinal: Negative for abdominal pain, diarrhea, nausea and vomiting.  Genitourinary: Negative for dysuria and flank pain.  Musculoskeletal: Negative for back pain, myalgias, neck pain and neck stiffness.  Skin: Negative for rash.  Allergic/Immunologic: Positive for immunocompromised state.  Neurological: Negative for dizziness, weakness, light-headedness, numbness and headaches.  Psychiatric/Behavioral: Negative for confusion.   Physical Exam Updated Vital Signs BP 121/67 (BP Location: Right Arm)   Temp 98.4 F (36.9 C) (Oral)   Resp 16   Ht  (1.778 m)   Wt 73.5 kg (162 lb)   SpO2 100%   BMI 23.24 kg/m   Physical Exam  Constitutional: He appears well-developed and well-nourished. No distress.  Nontoxic appearing.   HENT:  Head: Normocephalic.  Right Ear: Hearing, external ear and ear canal normal. No mastoid tenderness. Tympanic membrane is not injected and not erythematous. A middle ear effusion is present.  Left Ear: Hearing, external ear and ear canal normal. No mastoid tenderness. Tympanic membrane is injected. Tympanic membrane is not erythematous. A middle ear effusion is present.  Nose: Rhinorrhea present. No mucosal edema or sinus tenderness. Right sinus exhibits no maxillary sinus tenderness and no frontal sinus tenderness. Left sinus exhibits no maxillary sinus tenderness and no frontal sinus tenderness.  Mouth/Throat: Uvula is midline and mucous membranes are normal. No trismus in the jaw. No uvula swelling. No oropharyngeal exudate, posterior oropharyngeal edema, posterior oropharyngeal erythema or tonsillar abscesses. No tonsillar exudate.  Eyes: Pupils are equal, round, and reactive to light. EOM are normal. Right eye exhibits discharge and exudate. Right eye exhibits no chemosis and no hordeolum. No foreign body present in the right eye. Left eye exhibits discharge and exudate. Left eye exhibits no chemosis and no  hordeolum. No foreign body present in the left eye. Right conjunctiva is injected. Right conjunctiva has no hemorrhage. Left conjunctiva is injected. Left conjunctiva has no hemorrhage. No scleral icterus.  Neck: Normal range of motion. Neck supple.  No meningismus  Cardiovascular: Normal rate, regular rhythm, normal heart sounds and intact distal pulses. Exam reveals no gallop and no friction rub.  No murmur heard. Pulmonary/Chest: Effort normal. No stridor. No respiratory distress. He has no wheezes. He has no rales. He exhibits no tenderness.  Abdominal: Soft. He exhibits no distension.  Neurological: He is alert.  Skin: Skin is warm and dry. He is not diaphoretic.  Psychiatric: His behavior is normal.  Nursing note and vitals reviewed.    ED Treatments / Results  Labs (all labs ordered are listed, but only abnormal results are displayed) Labs Reviewed - No data to display  EKG None  Radiology Dg Chest 2 View  Result Date: 08/29/2017 CLINICAL DATA:  Cough and congestion 4 days. EXAM: CHEST - 2 VIEW COMPARISON:  10/02/2013 FINDINGS: Lungs are adequately inflated and otherwise clear. Cardiomediastinal silhouette and remainder of the exam is unchanged. IMPRESSION: No active cardiopulmonary disease. Electronically Signed   By: Elberta Fortis M.D.   On: 08/29/2017 12:30    Procedures Procedures (including critical care time)  Medications Ordered in ED Medications - No data to display   Initial Impression / Assessment and Plan / ED Course  I have reviewed the triage vital signs and the nursing notes.  Pertinent labs & imaging results that were available during my care of the patient were reviewed by me and considered in my medical decision making (see chart for details).     72 year old male with a history of DM Type II and HTN presenting with bilateral conjunctivitis, nasal congestion, fever, bilateral otalgia, and "feeling as if his ears won't pop." Mild to moderate symptoms  of clear/yellow nasal discharge/congestion and scratchy throat with cough for less than 10 days.  Patient is afebrile today, but febrile to 101 two days ago.  The patient was discussed with Dr. Particia Nearing, attending physician.  Given patient's recent fever, history of diabetes will treat with Augmentin to cover for acute bacterial sinusitis and erythromycin for bilateral bacterial conjunctivitis.  Doubt meningitis, Mnire's disease, mastoiditis, CVA, central vertigo, or malignant otits externa. Patient discharged with symptomatic treatment.  Patient instructions given for warm saline nasal washes.  Recommendations for follow-up with primary care physician this week.   Strict return precautions given.  The patient is hemodynamically stable and in no acute distress.  He is safe for discharge home at this time with outpatient follow-up.  Final Clinical Impressions(s) / ED Diagnoses   Final diagnoses:  Acute bacterial sinusitis  Acute bacterial conjunctivitis of both eyes    ED Discharge Orders        Ordered    amoxicillin-clavulanate (AUGMENTIN) 875-125 MG tablet  Every 12 hours     08/29/17 1407    erythromycin ophthalmic ointment     08/29/17 1407       Jasia Hiltunen A, PA-C 08/29/17 1440    Jacalyn Lefevre, MD 08/29/17 1528

## 2017-12-19 ENCOUNTER — Other Ambulatory Visit: Payer: Self-pay

## 2017-12-19 ENCOUNTER — Encounter (HOSPITAL_BASED_OUTPATIENT_CLINIC_OR_DEPARTMENT_OTHER): Payer: Self-pay | Admitting: Emergency Medicine

## 2017-12-19 ENCOUNTER — Emergency Department (HOSPITAL_BASED_OUTPATIENT_CLINIC_OR_DEPARTMENT_OTHER)
Admission: EM | Admit: 2017-12-19 | Discharge: 2017-12-19 | Disposition: A | Discharge status: Home / Self Care | Payer: Medicare Other | Attending: Emergency Medicine | Admitting: Emergency Medicine

## 2017-12-19 DIAGNOSIS — Z7984 Long term (current) use of oral hypoglycemic drugs: Secondary | ICD-10-CM | POA: Diagnosis not present

## 2017-12-19 DIAGNOSIS — E119 Type 2 diabetes mellitus without complications: Secondary | ICD-10-CM | POA: Diagnosis not present

## 2017-12-19 DIAGNOSIS — I10 Essential (primary) hypertension: Secondary | ICD-10-CM | POA: Diagnosis not present

## 2017-12-19 DIAGNOSIS — N410 Acute prostatitis: Secondary | ICD-10-CM | POA: Diagnosis not present

## 2017-12-19 DIAGNOSIS — N50812 Left testicular pain: Secondary | ICD-10-CM | POA: Diagnosis present

## 2017-12-19 DIAGNOSIS — Z79899 Other long term (current) drug therapy: Secondary | ICD-10-CM | POA: Diagnosis not present

## 2017-12-19 DIAGNOSIS — N419 Inflammatory disease of prostate, unspecified: Secondary | ICD-10-CM

## 2017-12-19 DIAGNOSIS — Z7902 Long term (current) use of antithrombotics/antiplatelets: Secondary | ICD-10-CM | POA: Insufficient documentation

## 2017-12-19 DIAGNOSIS — N50819 Testicular pain, unspecified: Secondary | ICD-10-CM

## 2017-12-19 LAB — URINALYSIS, ROUTINE W REFLEX MICROSCOPIC
BILIRUBIN URINE: NEGATIVE
Glucose, UA: NEGATIVE mg/dL
HGB URINE DIPSTICK: NEGATIVE
Ketones, ur: NEGATIVE mg/dL
Leukocytes, UA: NEGATIVE
NITRITE: NEGATIVE
PROTEIN: NEGATIVE mg/dL
Specific Gravity, Urine: <1.005 — ABNORMAL LOW (ref 1.005–1.030)
pH: 6 (ref 5.0–8.0)

## 2017-12-19 MED ORDER — CIPROFLOXACIN HCL 500 MG PO TABS: 500.0000 mg | ORAL_TABLET | Freq: Two times a day (BID) | ORAL | 0 refills | Status: AC

## 2017-12-19 NOTE — Discharge Instructions (Signed)
Follow up with your Urologist as soon as possible.  See your family doctor for recheck in 1 week. Return to the emergency department if you develop fever new or worsening pain or other concerning symptoms. You may take 1-2 of your Vicodin every 6 hours as needed for pain.

## 2017-12-19 NOTE — ED Notes (Signed)
Pt understood dc material. NAD noted. Script given at dc  

## 2017-12-19 NOTE — ED Triage Notes (Signed)
Generalized body aches x 1 week, denies fever. States he has prostate problems and may have an infection.

## 2017-12-19 NOTE — ED Provider Notes (Signed)
[facility] EMERGENCY DEPARTMENT Provider Note   CSN: [id number] Arrival date & time: 12/19/17  1605     History   Chief Complaint Chief Complaint  Patient presents with  . Generalized Body Aches    HPI Nathaniel Davis is a 72 y.o. male.  HPI Reports he has had some pain like pressure in the left testicle and the upper scrotum area on the base of the penis.  He reports this is similar to when he had prostatitis in the past.  Had to be treated off-and-on by his urologist.  So has some general aching of the lower back.  He reports sometimes he is felt aching in his upper legs and upper arms.  Documented fever.  No nausea no vomiting.  No abdominal pain.  He denies that he feels like he has had inability to urinate.  He reports sometimes when he has similar problems, his urologist is done a prostate massage on him and sometimes put him on antibiotics or both.  He has called the urologist but cannot get an appointment until September. Past Medical History:  Diagnosis Date  . AKI (acute kidney injury) (HCC)   . Diabetes mellitus without complication (HCC)   . Hypertension     Patient Active Problem List   Diagnosis Date Noted  . Dizziness 12/07/2015  . Dizzy 12/07/2015  . Ataxia 12/07/2015  . Bradycardia 12/07/2015  . Hypertension   . Diabetes mellitus without complication (HCC)   . AKI (acute kidney injury) (HCC)     History reviewed. No pertinent surgical history.      Home Medications    Prior to Admission medications   Medication Sig Start Date End Date Taking? Authorizing Provider  ALPRAZolam Prudy Feeler) 1 MG tablet Take 0.5 mg by mouth See admin instructions. Take 1/2 tablet (0.5 mg) by mouth daily at bedtime, may also take 1/2 tablet 2 times daily as needed for anxiety    [provider]  amoxicillin-clavulanate (AUGMENTIN) 875-125 MG tablet Take 1 tablet by mouth every 12 (twelve) hours. 08/29/17   McDonald, Mia A, PA-C  atenolol (TENORMIN) 50 MG tablet  Take 25 mg by mouth daily.     [provider]  carbamazepine (TEGRETOL) 200 MG tablet Take 400 mg by mouth 3 (three) times daily.     [provider]  ciprofloxacin (CIPRO) 500 MG tablet Take 1 tablet (500 mg total) by mouth 2 (two) times daily. One po bid x 7 days 12/19/17   Arby Barrette, MD  clobetasol (TEMOVATE) 0.05 % external solution Apply 1 application topically See admin instructions. Apply to scalp at bedtime as needed for itching 09/28/13   [provider]  clopidogrel (PLAVIX) 75 MG tablet Take 1 tablet (75 mg total) by mouth daily. 12/09/15   Alison Murray, MD  diphenoxylate-atropine (LOMOTIL) 2.5-0.025 MG per tablet Take 1 tablet by mouth 2 (two) times daily as needed for diarrhea or loose stools.     [provider]  doxepin (SINEQUAN) 25 MG capsule Take 100 mg by mouth at bedtime.     [provider]  erythromycin ophthalmic ointment Place a 1/2 inch ribbon of ointment into the left and the right lower eyelids every 6 hours while you are awake for 5 days. 08/29/17   McDonald, Mia A, PA-C  HYDROcodone-acetaminophen (NORCO) 10-325 MG per tablet Take 1 tablet by mouth every 6 (six) hours as needed (pain).     [provider]  hydrOXYzine (ATARAX/VISTARIL) 25 MG tablet Take  25 mg by mouth 3 (three) times daily as needed for itching.     [provider]  indomethacin (INDOCIN) 50 MG capsule Take 50 mg by mouth 3 (three) times daily with meals.     [provider]  lisinopril (PRINIVIL,ZESTRIL) 5 MG tablet Take 5 mg by mouth daily. 11/28/15   [provider]  meclizine (ANTIVERT) 12.5 MG tablet Take 12.5 mg by mouth 3 (three) times daily. 11/28/15   [provider]  metFORMIN (GLUCOPHAGE) 500 MG tablet Take 500 mg by mouth 3 (three) times daily with meals.    [provider]  pantoprazole (PROTONIX) 40 MG tablet Take 40 mg by mouth daily after supper.     [provider]  pravastatin  (PRAVACHOL) 40 MG tablet Take 1 tablet (40 mg total) by mouth daily. 12/09/15   Alison Murrayevine, Alma M, MD  promethazine (PHENERGAN) 25 MG suppository Place 25 mg rectally every 6 (six) hours as needed for nausea or vomiting.    [provider]  promethazine (PHENERGAN) 25 MG tablet Take 25 mg by mouth 2 (two) times daily as needed for nausea or vomiting.     [provider]  sertraline (ZOLOFT) 100 MG tablet Take 150 mg by mouth daily.     [provider]    Family History Family History  Problem Relation Age of Onset  . Diabetes Mother   . Hypertension Father     Social History Social History   Tobacco Use  . Smoking status: Never Smoker  . Smokeless tobacco: Never Used  Substance Use Topics  . Alcohol use: Yes    Comment: occasionally monthly  . Drug use: No     Allergies   Crestor [rosuvastatin]; Iodinated diagnostic agents; and Sulfa antibiotics   Review of Systems Review of Systems  10 Systems reviewed and are negative for acute change except as noted in the HPI.  Physical Exam Updated Vital Signs BP (!) 144/74 (BP Location: Left Arm)   Pulse 63   Temp 98 F (36.7 C) (Oral)   Resp 16   Ht 5\' 10"  (1.778 m)   Wt 71.2 kg   SpO2 100%   BMI 22.53 kg/m   Physical Exam  Constitutional: He is oriented to person, place, and time. He appears well-developed and well-nourished.  HENT:  Head: Normocephalic and atraumatic.  Eyes: Pupils are equal, round, and reactive to light. EOM are normal.  Neck: Neck supple.  Cardiovascular: Normal rate, regular rhythm, normal heart sounds and intact distal pulses.  Pulmonary/Chest: Effort normal and breath sounds normal.  Abdominal: Soft. Bowel sounds are normal. He exhibits no distension. There is no tenderness. There is no guarding.  Genitourinary:  Genitourinary Comments: Penis normal.  Testicles normal in appearance without evident swelling.  Tenderness of left testicle without mass or fullness.  Inguinal l  canals are clear.  Rectal exam, prostate does not feel significantly enlarged or nodular.  Mild tenderness to palpation but not exquisitely painful.  Musculoskeletal: Normal range of motion. He exhibits no edema.  Neurological: He is alert and oriented to person, place, and time. He has normal strength. Coordination normal. GCS eye subscore is 4. GCS verbal subscore is 5. GCS motor subscore is 6.  Skin: Skin is warm, dry and intact.  Psychiatric: He has a normal mood and affect.     ED Treatments / Results  Labs (all labs ordered are listed, but only abnormal results are displayed) Labs Reviewed  URINALYSIS, ROUTINE W REFLEX MICROSCOPIC -  Abnormal; Notable for the following components:      Result Value   Color, Urine STRAW (*)    Specific Gravity, Urine <1.005 (*)    All other components within normal limits    EKG None  Radiology No results found.  Procedures Procedures (including critical care time)  Medications Ordered in ED Medications - No data to display   Initial Impression / Assessment and Plan / ED Course  I have reviewed the triage vital signs and the nursing notes.  Pertinent labs & imaging results that were available during my care of the patient were reviewed by me and considered in my medical decision making (see chart for details).      Final Clinical Impressions(s) / ED Diagnoses   Final diagnoses:  Testicle pain  Prostatitis, unspecified prostatitis type  Patient does not have urinary retention on bladder scan.  Urinalysis negative.  Patient reports he has had similar symptoms on several occasions which have been due to prostatitis.  At this time will treat empirically with ciprofloxacin and close follow-up.  And has Vicodin at home to take on a as needed basis.  Otherwise he is clinically well in appearance.  Return precautions reviewed.  ED Discharge Orders         Ordered    ciprofloxacin (CIPRO) 500 MG tablet  2 times daily     12/19/17 1733             Arby BarrettePfeiffer, Kaylin Marcon, MD 12/19/17 1744

## 2019-03-02 IMAGING — CR DG CHEST 2V
2 series · 2 of 2 positions shown · non-contrast
Comparison: 10/02/2013

CLINICAL DATA: Cough and congestion 4 days.

EXAM:
CHEST - 2 VIEW

[w chest pa]
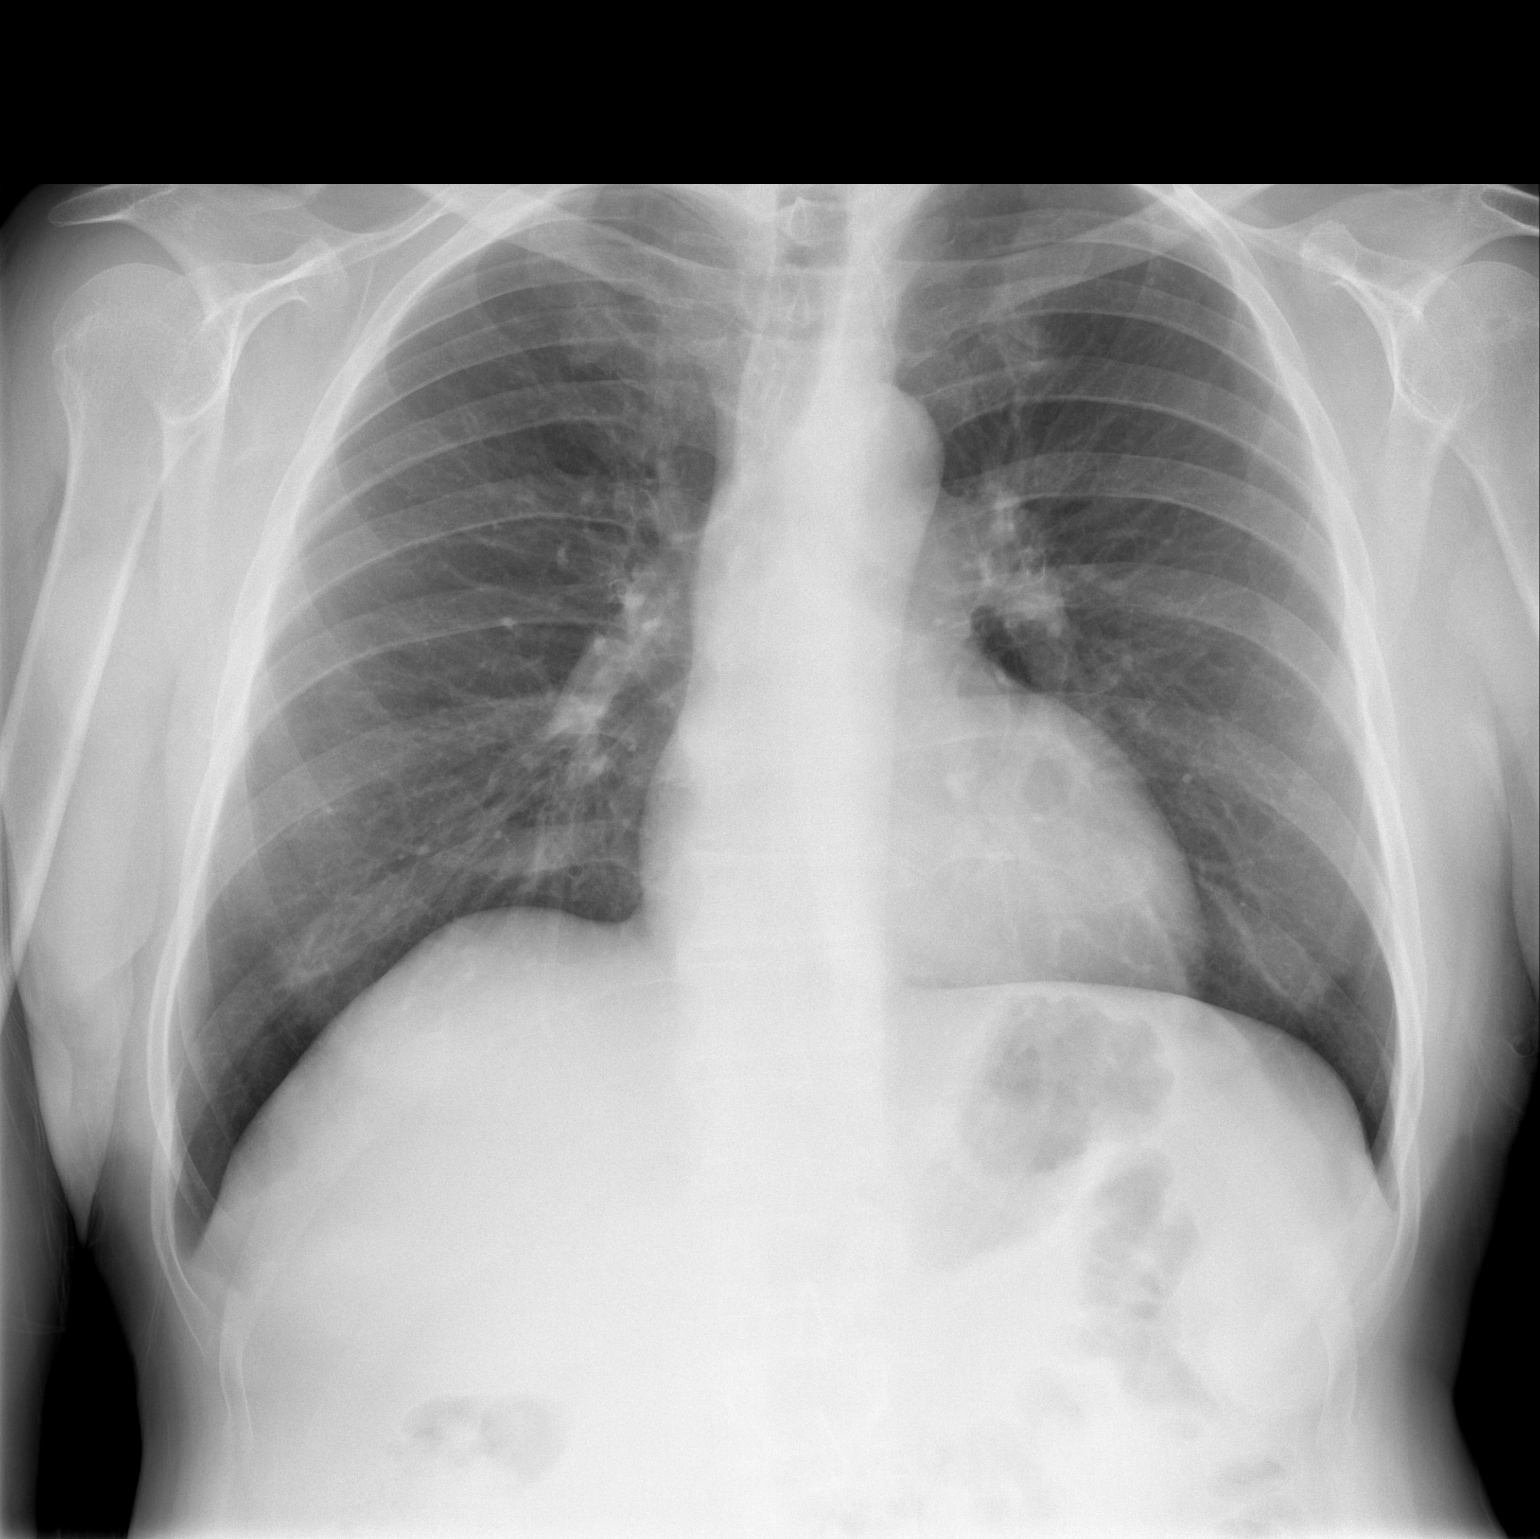

[w chest lat]
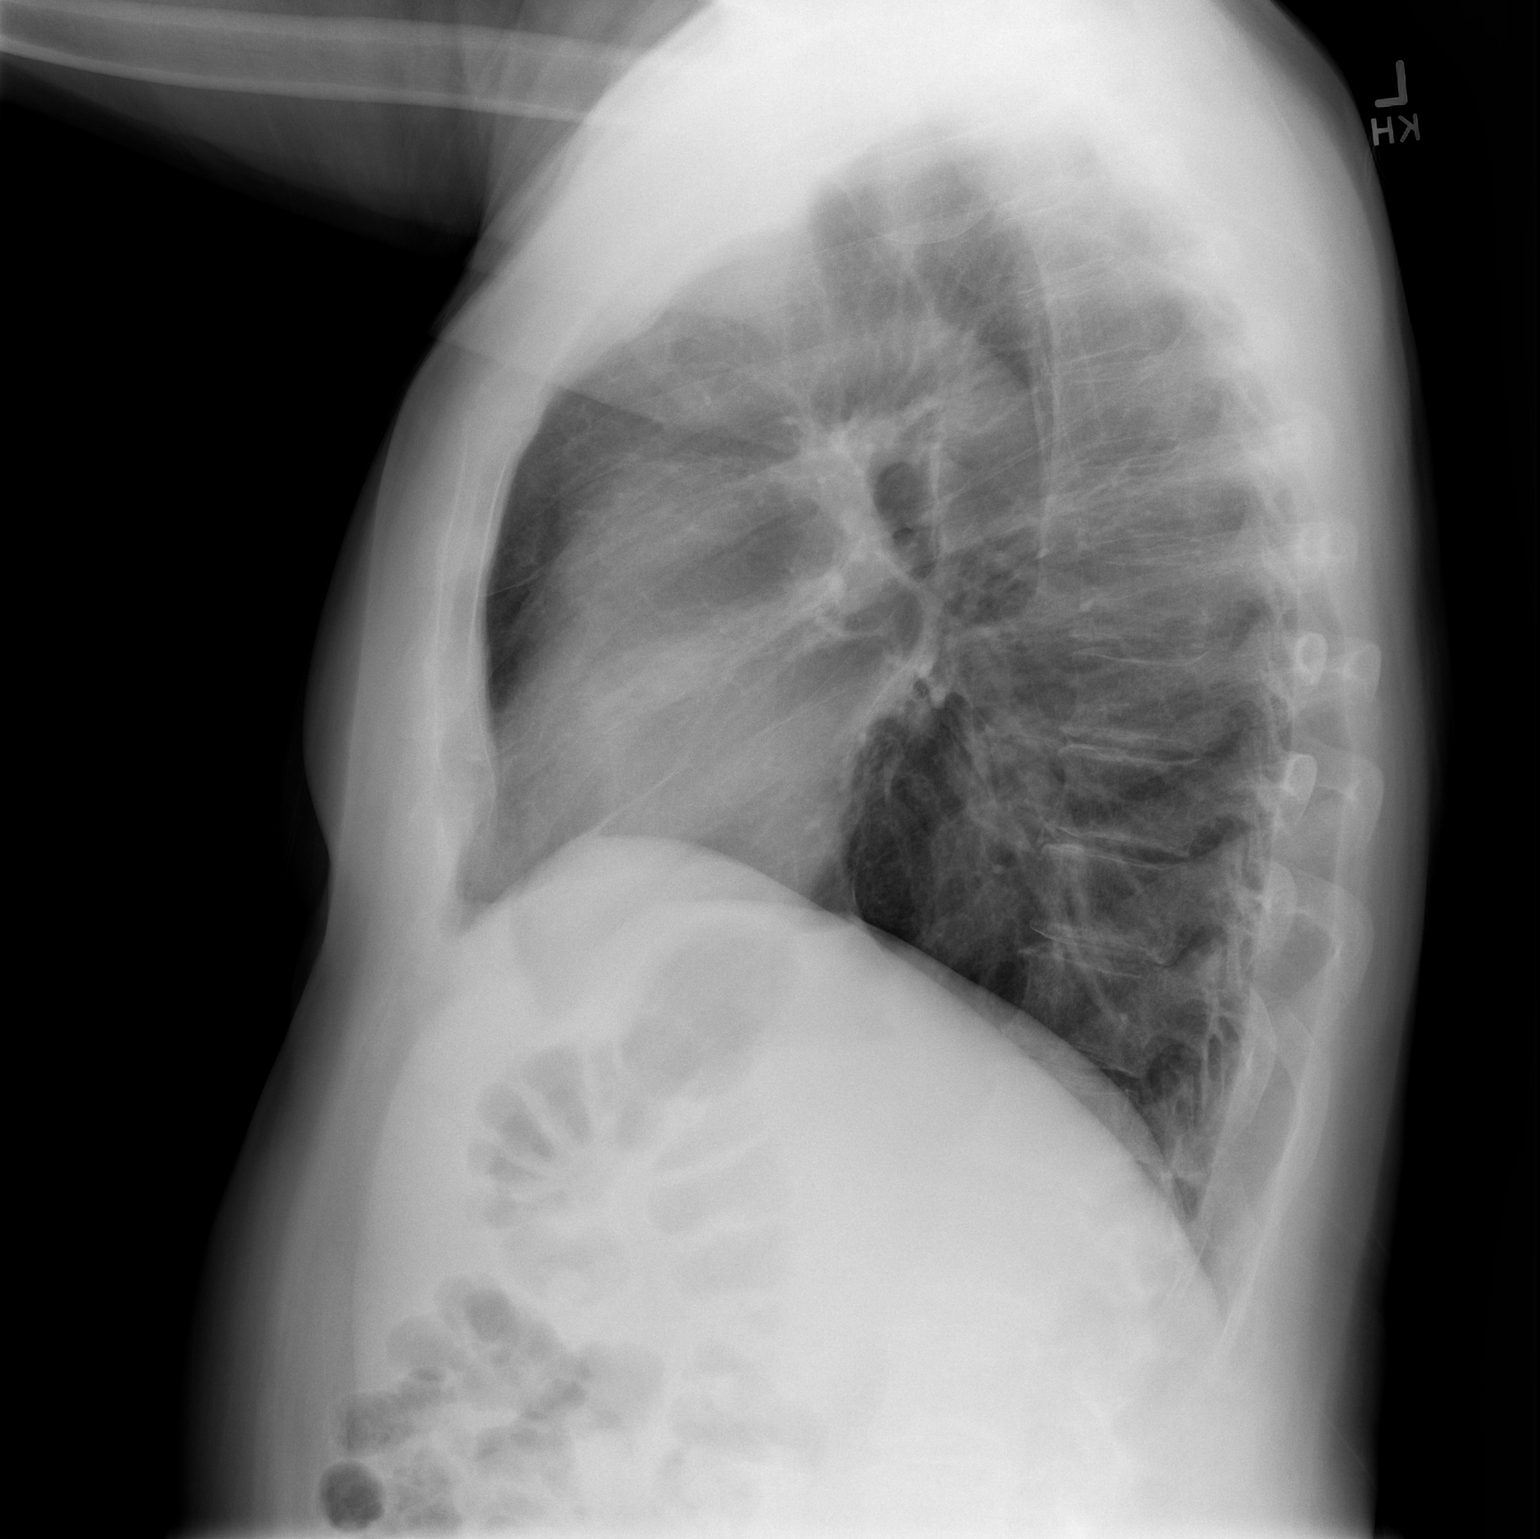

[2 of 2 positions shown; findings below may reference images not displayed]

FINDINGS: Lungs are adequately inflated and otherwise clear. Cardiomediastinal
silhouette and remainder of the exam is unchanged.
IMPRESSION: No active cardiopulmonary disease.

## 2019-05-24 ENCOUNTER — Ambulatory Visit: Payer: Medicare Other | Attending: Internal Medicine

## 2019-05-24 DIAGNOSIS — Z23 Encounter for immunization: Secondary | ICD-10-CM | POA: Insufficient documentation

## 2019-05-24 NOTE — Progress Notes (Signed)
   Covid-19 Vaccination Clinic  Name:  Nathaniel Davis    MRN: [id number] DOB: June 22, 1945  05/24/2019  [name] was observed post Covid-19 immunization for 15 minutes without incidence. He was provided with Vaccine Information Sheet and instruction to access the V-Safe system.   [name] was instructed to call 911 with any severe reactions post vaccine: Marland Kitchen Difficulty breathing  . Swelling of your face and throat  . A fast heartbeat  . A bad rash all over your body  . Dizziness and weakness    Immunizations Administered    Name Date Dose VIS Date Route   Pfizer COVID-19 Vaccine 05/24/2019  9:52 AM 0.3 mL 04/14/2019 Intramuscular   Manufacturer: [employer], [employer]   Lot: [id number]   NDC: [address]-8

## 2019-06-14 ENCOUNTER — Ambulatory Visit: Payer: Medicare HMO | Attending: Internal Medicine

## 2019-06-14 DIAGNOSIS — Z23 Encounter for immunization: Secondary | ICD-10-CM | POA: Insufficient documentation

## 2019-06-14 NOTE — Progress Notes (Signed)
   Covid-19 Vaccination Clinic  Name:  Tolbert Matheson    MRN: [id number] DOB: 1946/02/10  06/14/2019  Mr. Rufo was observed post Covid-19 immunization for 15 minutes without incidence. He was provided with Vaccine Information Sheet and instruction to access the V-Safe system.   Mr. Brunty was instructed to call 911 with any severe reactions post vaccine: Marland Kitchen Difficulty breathing  . Swelling of your face and throat  . A fast heartbeat  . A bad rash all over your body  . Dizziness and weakness    Immunizations Administered    Name Date Dose VIS Date Route   Pfizer COVID-19 Vaccine 06/14/2019 12:05 PM 0.3 mL 04/14/2019 Intramuscular   Manufacturer: [employer], [employer]   Lot: [id number]   NDC: [address]-4

## 2020-05-15 ENCOUNTER — Other Ambulatory Visit: Payer: Self-pay

## 2020-05-15 ENCOUNTER — Encounter (HOSPITAL_BASED_OUTPATIENT_CLINIC_OR_DEPARTMENT_OTHER): Payer: Self-pay | Admitting: Emergency Medicine

## 2020-05-15 ENCOUNTER — Emergency Department (HOSPITAL_BASED_OUTPATIENT_CLINIC_OR_DEPARTMENT_OTHER)
Admission: EM | Admit: 2020-05-15 | Discharge: 2020-05-15 | Disposition: A | Discharge status: Home / Self Care | Payer: Medicare HMO | Attending: Emergency Medicine | Admitting: Emergency Medicine

## 2020-05-15 DIAGNOSIS — Z7984 Long term (current) use of oral hypoglycemic drugs: Secondary | ICD-10-CM | POA: Insufficient documentation

## 2020-05-15 DIAGNOSIS — Z79899 Other long term (current) drug therapy: Secondary | ICD-10-CM | POA: Diagnosis not present

## 2020-05-15 DIAGNOSIS — Z7901 Long term (current) use of anticoagulants: Secondary | ICD-10-CM | POA: Insufficient documentation

## 2020-05-15 DIAGNOSIS — E119 Type 2 diabetes mellitus without complications: Secondary | ICD-10-CM | POA: Diagnosis not present

## 2020-05-15 DIAGNOSIS — L7622 Postprocedural hemorrhage and hematoma of skin and subcutaneous tissue following other procedure: Secondary | ICD-10-CM | POA: Diagnosis present

## 2020-05-15 DIAGNOSIS — M7989 Other specified soft tissue disorders: Secondary | ICD-10-CM | POA: Insufficient documentation

## 2020-05-15 DIAGNOSIS — I10 Essential (primary) hypertension: Secondary | ICD-10-CM | POA: Diagnosis not present

## 2020-05-15 DIAGNOSIS — R58 Hemorrhage, not elsewhere classified: Secondary | ICD-10-CM

## 2020-05-15 LAB — CBC WITH DIFFERENTIAL/PLATELET
Abs Immature Granulocytes: 0.03 10*3/uL (ref 0.00–0.07)
Basophils Absolute: 0 10*3/uL (ref 0.0–0.1)
Basophils Relative: 0 %
Eosinophils Absolute: 0 10*3/uL (ref 0.0–0.5)
Eosinophils Relative: 0 %
HCT: 36.4 % — ABNORMAL LOW (ref 39.0–52.0)
Hemoglobin: 12.3 g/dL — ABNORMAL LOW (ref 13.0–17.0)
Immature Granulocytes: 0 %
Lymphocytes Relative: 8 %
Lymphs Abs: 0.7 10*3/uL (ref 0.7–4.0)
MCH: 32.5 pg (ref 26.0–34.0)
MCHC: 33.8 g/dL (ref 30.0–36.0)
MCV: 96.3 fL (ref 80.0–100.0)
Monocytes Absolute: 0.3 10*3/uL (ref 0.1–1.0)
Monocytes Relative: 3 %
Neutro Abs: 8.2 10*3/uL — ABNORMAL HIGH (ref 1.7–7.7)
Neutrophils Relative %: 89 %
Platelets: 141 10*3/uL — ABNORMAL LOW (ref 150–400)
RBC: 3.78 MIL/uL — ABNORMAL LOW (ref 4.22–5.81)
RDW: 12.5 % (ref 11.5–15.5)
WBC: 9.3 10*3/uL (ref 4.0–10.5)
nRBC: 0 % (ref 0.0–0.2)

## 2020-05-15 LAB — BASIC METABOLIC PANEL
Anion gap: 9 (ref 5–15)
BUN: 26 mg/dL — ABNORMAL HIGH (ref 8–23)
CO2: 25 mmol/L (ref 22–32)
Calcium: 8.6 mg/dL — ABNORMAL LOW (ref 8.9–10.3)
Chloride: 105 mmol/L (ref 98–111)
Creatinine, Ser: 1.69 mg/dL — ABNORMAL HIGH (ref 0.61–1.24)
GFR, Estimated: 42 mL/min — ABNORMAL LOW (ref >60)
Glucose, Bld: 144 mg/dL — ABNORMAL HIGH (ref 70–99)
Potassium: 4.4 mmol/L (ref 3.5–5.1)
Sodium: 139 mmol/L (ref 135–145)

## 2020-05-15 NOTE — ED Provider Notes (Signed)
[facility] EMERGENCY DEPARTMENT Provider Note   CSN: [id number] Arrival date & time: 05/15/20  2120     History Chief Complaint  Patient presents with  . Post-op Problem    Nathaniel Davis is a 75 y.o. male.  The history is provided by the patient, medical records and the EMS personnel.   Nathaniel Davis is a 75 y.o. male who presents to the Emergency Department complaining of postoperative bleeding. He presents to the emergency department by EMS for evaluation of bleeding from his left knee. He had an arthroscopic procedure today and was discharged home from [facility]. He states that this evening he developed bleeding from the area. He denies any local pain. He did have a nerve block. He does take Plavix and did not take it today or yesterday.Marland Kitchen No prior history of bleeding issues.    Past Medical History:  Diagnosis Date  . AKI (acute kidney injury) (HCC)   . Diabetes mellitus without complication (HCC)   . Hypertension     Patient Active Problem List   Diagnosis Date Noted  . Dizziness 12/07/2015  . Dizzy 12/07/2015  . Ataxia 12/07/2015  . Bradycardia 12/07/2015  . Hypertension   . Diabetes mellitus without complication (HCC)   . AKI (acute kidney injury) [facility])     Past Surgical History:  Procedure Laterality Date  . KNEE ARTHROSCOPY         Family History  Problem Relation Age of Onset  . Diabetes Mother   . Hypertension Father     Social History   Tobacco Use  . Smoking status: Never Smoker  . Smokeless tobacco: Never Used  Substance Use Topics  . Alcohol use: Yes    Comment: occasionally monthly  . Drug use: No    Home Medications Prior to Admission medications   Medication Sig Start Date End Date Taking? Authorizing Provider  ALPRAZolam Prudy Feeler) 1 MG tablet Take 0.5 mg by mouth See admin instructions. Take 1/2 tablet (0.5 mg) by mouth daily at bedtime, may also take 1/2 tablet 2 times daily as needed for anxiety     [provider]  amoxicillin-clavulanate (AUGMENTIN) 875-125 MG tablet Take 1 tablet by mouth every 12 (twelve) hours. 08/29/17   McDonald, Mia A, PA-C  atenolol (TENORMIN) 50 MG tablet Take 25 mg by mouth daily.     [provider]  carbamazepine (TEGRETOL) 200 MG tablet Take 400 mg by mouth 3 (three) times daily.     [provider]  ciprofloxacin (CIPRO) 500 MG tablet Take 1 tablet (500 mg total) by mouth 2 (two) times daily. One po bid x 7 days 12/19/17   Arby Barrette, MD  clobetasol (TEMOVATE) 0.05 % external solution Apply 1 application topically See admin instructions. Apply to scalp at bedtime as needed for itching 09/28/13   [provider]  clopidogrel (PLAVIX) 75 MG tablet Take 1 tablet (75 mg total) by mouth daily. 12/09/15   Alison Murray, MD  diphenoxylate-atropine (LOMOTIL) 2.5-0.025 MG per tablet Take 1 tablet by mouth 2 (two) times daily as needed for diarrhea or loose stools.     [provider]  doxepin (SINEQUAN) 25 MG capsule Take 100 mg by mouth at bedtime.     [provider]  erythromycin ophthalmic ointment Place a 1/2 inch ribbon of ointment into the left and the right lower eyelids every 6 hours while you are awake for 5 days. 08/29/17   McDonald, Mia A, PA-C  HYDROcodone-acetaminophen (NORCO) 10-325 MG per tablet Take 1 tablet by mouth every 6 (six) hours as needed (pain).     [provider]  hydrOXYzine (ATARAX/VISTARIL) 25 MG tablet Take 25 mg by mouth 3 (three) times daily as needed for itching.     [provider]  indomethacin (INDOCIN) 50 MG capsule Take 50 mg by mouth 3 (three) times daily with meals.     [provider]  lisinopril (PRINIVIL,ZESTRIL) 5 MG tablet Take 5 mg by mouth daily. 11/28/15   [provider]  meclizine (ANTIVERT) 12.5 MG tablet Take 12.5 mg by mouth 3 (three) times daily. 11/28/15   [provider]  metFORMIN (GLUCOPHAGE) 500 MG tablet Take 500  mg by mouth 3 (three) times daily with meals.    [provider]  pantoprazole (PROTONIX) 40 MG tablet Take 40 mg by mouth daily after supper.     [provider]  pravastatin (PRAVACHOL) 40 MG tablet Take 1 tablet (40 mg total) by mouth daily. 12/09/15   Alison Murray, MD  promethazine (PHENERGAN) 25 MG suppository Place 25 mg rectally every 6 (six) hours as needed for nausea or vomiting.    [provider]  promethazine (PHENERGAN) 25 MG tablet Take 25 mg by mouth 2 (two) times daily as needed for nausea or vomiting.     [provider]  sertraline (ZOLOFT) 100 MG tablet Take 150 mg by mouth daily.     [provider]    Allergies    Crestor [rosuvastatin], Lunesta [eszopiclone], Iodinated diagnostic agents, and Sulfa antibiotics  Review of Systems   Review of Systems  All other systems reviewed and are negative.   Physical Exam Updated Vital Signs BP (!) 138/105   Pulse 65   Temp 98.6 F (37 C) (Oral)   Resp 18   Ht 5\' 10"  (1.778 m)   Wt 76.2 kg   SpO2 98%   BMI 24.11 kg/m   Physical Exam Vitals and nursing note reviewed.  Constitutional:      Appearance: He is well-developed and well-nourished.  HENT:     Head: Normocephalic and atraumatic.  Cardiovascular:     Rate and Rhythm: Normal rate and regular rhythm.  Pulmonary:     Effort: Pulmonary effort is normal. No respiratory distress.  Abdominal:     Tenderness: There is no guarding.  Musculoskeletal:        General: No tenderness or edema.     Comments: 2+ DP pulses bilaterally. There was a blood soaked ABD and soft cotton dressing removed from the left knee. The knee has mild soft tissue swelling. There are two surgical sites at the inferior portion of the knee that are approximately half centimeters in length each. The medial site has moderate study venous bleeding. The lateral side has mild venous bleeding.  Skin:    General: Skin is warm and dry.  Neurological:      Mental Status: He is alert and oriented to person, place, and time.  Psychiatric:        Mood and Affect: Mood and affect normal.        Behavior: Behavior normal.     ED Results / Procedures / Treatments   Labs (all labs ordered are listed, but only abnormal results are displayed) Labs Reviewed  BASIC METABOLIC PANEL - Abnormal; Notable for the following components:      Result Value   Glucose, Bld 144 (*)    BUN 26 (*)  Creatinine, Ser 1.69 (*)    Calcium 8.6 (*)    GFR, Estimated 42 (*)    All other components within normal limits  CBC WITH DIFFERENTIAL/PLATELET - Abnormal; Notable for the following components:   RBC 3.78 (*)    Hemoglobin 12.3 (*)    HCT 36.4 (*)    Platelets 141 (*)    Neutro Abs 8.2 (*)    All other components within normal limits    EKG None  Radiology No results found.  Procedures Procedures (including critical care time)  Medications Ordered in ED Medications - No data to display  ED Course  I have reviewed the triage vital signs and the nursing notes.  Pertinent labs & imaging results that were available during my care of the patient were reviewed by me and considered in my medical decision making (see chart for details).    MDM Rules/Calculators/A&P                         patient here for evaluation of postoperative bleeding. Patient with bloodsoaked dressings on ED presentation. Dressings were removed and the area was cleansed of blood. Direct pressure was placed over the two bleeding surgical sites for five minutes. A pressure dressing was then placed over these sites and held in place for about one hour. He was observed with no recurrent bleeding and the pressure dressing was changed to a dressing that would be more comfortable for home. Discussed with patient orthopedic surgeon, Dr. Christell Constant. He does not recommend sutures or additional interventions at this time. Will place in a new mobilizer to limit movement for the time being.  Discussed with patient home care for postoperative bleeding. Discussed outpatient follow-up and return precautions.   CBC with stable thrombocytopenia and anemia. BMP with stable renal insufficiency.  Final Clinical Impression(s) / ED Diagnoses Final diagnoses:  Bleeding    Rx / DC Orders ED Discharge Orders    None       Tilden Fossa, MD 05/15/20 2355

## 2020-05-15 NOTE — ED Triage Notes (Signed)
Pt c/o bleeding from left knee after having surgery today at 1400. Pt was discharged home and told that he would have some oozing but pt dressing is completely soaked but no active bleeding at this time. Pt states that he was able to ambulate up stairs when getting into home. Pt states that he did stop taking his plavix two days ago. Pt aaox3, VSS, GCS 15, NAD noted. No bleeding noted during triage.

## 2020-05-15 NOTE — Discharge Instructions (Addendum)
Keep your dressing in place for the next 48 hours. Get rechecked immediately if you soak through your dressing. Use the knee immobilizer when you are up for the next few days. Please contact your orthopedic surgeon in the morning regarding follow-up instructions.

## 2020-09-04 DIAGNOSIS — M1712 Unilateral primary osteoarthritis, left knee: Secondary | ICD-10-CM | POA: Diagnosis not present

## 2020-09-05 DIAGNOSIS — M2560 Stiffness of unspecified joint, not elsewhere classified: Secondary | ICD-10-CM | POA: Diagnosis not present

## 2020-09-05 DIAGNOSIS — S83242A Other tear of medial meniscus, current injury, left knee, initial encounter: Secondary | ICD-10-CM | POA: Diagnosis not present

## 2020-09-05 DIAGNOSIS — R262 Difficulty in walking, not elsewhere classified: Secondary | ICD-10-CM | POA: Diagnosis not present

## 2020-09-05 DIAGNOSIS — M6281 Muscle weakness (generalized): Secondary | ICD-10-CM | POA: Diagnosis not present

## 2020-09-26 DIAGNOSIS — H35363 Drusen (degenerative) of macula, bilateral: Secondary | ICD-10-CM | POA: Diagnosis not present

## 2020-09-26 DIAGNOSIS — H11152 Pinguecula, left eye: Secondary | ICD-10-CM | POA: Diagnosis not present

## 2020-09-26 DIAGNOSIS — H2513 Age-related nuclear cataract, bilateral: Secondary | ICD-10-CM | POA: Diagnosis not present

## 2020-09-26 DIAGNOSIS — E119 Type 2 diabetes mellitus without complications: Secondary | ICD-10-CM | POA: Diagnosis not present

## 2020-10-01 DIAGNOSIS — I152 Hypertension secondary to endocrine disorders: Secondary | ICD-10-CM | POA: Diagnosis not present

## 2020-10-01 DIAGNOSIS — G500 Trigeminal neuralgia: Secondary | ICD-10-CM | POA: Diagnosis not present

## 2020-10-01 DIAGNOSIS — F5101 Primary insomnia: Secondary | ICD-10-CM | POA: Diagnosis not present

## 2020-10-01 DIAGNOSIS — E782 Mixed hyperlipidemia: Secondary | ICD-10-CM | POA: Diagnosis not present

## 2020-10-01 DIAGNOSIS — E119 Type 2 diabetes mellitus without complications: Secondary | ICD-10-CM | POA: Diagnosis not present

## 2020-10-01 DIAGNOSIS — N183 Chronic kidney disease, stage 3 unspecified: Secondary | ICD-10-CM | POA: Diagnosis not present

## 2020-10-01 DIAGNOSIS — E1122 Type 2 diabetes mellitus with diabetic chronic kidney disease: Secondary | ICD-10-CM | POA: Diagnosis not present

## 2020-10-01 DIAGNOSIS — E1159 Type 2 diabetes mellitus with other circulatory complications: Secondary | ICD-10-CM | POA: Diagnosis not present

## 2020-10-02 DIAGNOSIS — B078 Other viral warts: Secondary | ICD-10-CM | POA: Diagnosis not present

## 2020-10-02 DIAGNOSIS — D485 Neoplasm of uncertain behavior of skin: Secondary | ICD-10-CM | POA: Diagnosis not present

## 2020-10-23 DIAGNOSIS — S83242D Other tear of medial meniscus, current injury, left knee, subsequent encounter: Secondary | ICD-10-CM | POA: Diagnosis not present

## 2020-10-23 DIAGNOSIS — M1712 Unilateral primary osteoarthritis, left knee: Secondary | ICD-10-CM | POA: Diagnosis not present

## 2020-11-13 DIAGNOSIS — R52 Pain, unspecified: Secondary | ICD-10-CM | POA: Diagnosis not present

## 2020-11-13 DIAGNOSIS — M1712 Unilateral primary osteoarthritis, left knee: Secondary | ICD-10-CM | POA: Diagnosis not present

## 2020-11-25 DIAGNOSIS — D696 Thrombocytopenia, unspecified: Secondary | ICD-10-CM | POA: Diagnosis not present

## 2020-11-25 DIAGNOSIS — D649 Anemia, unspecified: Secondary | ICD-10-CM | POA: Diagnosis not present

## 2020-12-11 DIAGNOSIS — G8929 Other chronic pain: Secondary | ICD-10-CM | POA: Diagnosis not present

## 2020-12-11 DIAGNOSIS — R52 Pain, unspecified: Secondary | ICD-10-CM | POA: Diagnosis not present

## 2020-12-11 DIAGNOSIS — M25512 Pain in left shoulder: Secondary | ICD-10-CM | POA: Diagnosis not present

## 2021-01-01 DIAGNOSIS — M1712 Unilateral primary osteoarthritis, left knee: Secondary | ICD-10-CM | POA: Diagnosis not present

## 2021-01-01 DIAGNOSIS — M25512 Pain in left shoulder: Secondary | ICD-10-CM | POA: Diagnosis not present

## 2021-01-01 DIAGNOSIS — G8929 Other chronic pain: Secondary | ICD-10-CM | POA: Diagnosis not present

## 2021-01-22 DIAGNOSIS — M5382 Other specified dorsopathies, cervical region: Secondary | ICD-10-CM | POA: Diagnosis not present

## 2021-01-22 DIAGNOSIS — M542 Cervicalgia: Secondary | ICD-10-CM | POA: Diagnosis not present

## 2021-01-22 DIAGNOSIS — M6289 Other specified disorders of muscle: Secondary | ICD-10-CM | POA: Diagnosis not present

## 2021-01-22 DIAGNOSIS — M25512 Pain in left shoulder: Secondary | ICD-10-CM | POA: Diagnosis not present

## 2021-01-22 DIAGNOSIS — G8929 Other chronic pain: Secondary | ICD-10-CM | POA: Diagnosis not present

## 2021-02-03 DIAGNOSIS — M25512 Pain in left shoulder: Secondary | ICD-10-CM | POA: Diagnosis not present

## 2021-02-05 DIAGNOSIS — G8929 Other chronic pain: Secondary | ICD-10-CM | POA: Diagnosis not present

## 2021-02-05 DIAGNOSIS — M25512 Pain in left shoulder: Secondary | ICD-10-CM | POA: Diagnosis not present

## 2021-02-17 DIAGNOSIS — G8929 Other chronic pain: Secondary | ICD-10-CM | POA: Diagnosis not present

## 2021-02-17 DIAGNOSIS — M25512 Pain in left shoulder: Secondary | ICD-10-CM | POA: Diagnosis not present

## 2021-02-24 DIAGNOSIS — M25512 Pain in left shoulder: Secondary | ICD-10-CM | POA: Diagnosis not present

## 2021-02-24 DIAGNOSIS — G8929 Other chronic pain: Secondary | ICD-10-CM | POA: Diagnosis not present

## 2021-02-26 DIAGNOSIS — M25512 Pain in left shoulder: Secondary | ICD-10-CM | POA: Diagnosis not present

## 2021-02-26 DIAGNOSIS — G8929 Other chronic pain: Secondary | ICD-10-CM | POA: Diagnosis not present

## 2021-02-27 DIAGNOSIS — G500 Trigeminal neuralgia: Secondary | ICD-10-CM | POA: Diagnosis not present

## 2021-03-12 DIAGNOSIS — E119 Type 2 diabetes mellitus without complications: Secondary | ICD-10-CM | POA: Diagnosis not present

## 2021-03-12 DIAGNOSIS — I129 Hypertensive chronic kidney disease with stage 1 through stage 4 chronic kidney disease, or unspecified chronic kidney disease: Secondary | ICD-10-CM | POA: Diagnosis not present

## 2021-03-12 DIAGNOSIS — S0432XS Injury of trigeminal nerve, left side, sequela: Secondary | ICD-10-CM | POA: Diagnosis not present

## 2021-03-12 DIAGNOSIS — X58XXXS Exposure to other specified factors, sequela: Secondary | ICD-10-CM | POA: Diagnosis not present

## 2021-03-12 DIAGNOSIS — G500 Trigeminal neuralgia: Secondary | ICD-10-CM | POA: Diagnosis not present

## 2021-03-12 DIAGNOSIS — N183 Chronic kidney disease, stage 3 unspecified: Secondary | ICD-10-CM | POA: Diagnosis not present

## 2021-03-12 DIAGNOSIS — Z8673 Personal history of transient ischemic attack (TIA), and cerebral infarction without residual deficits: Secondary | ICD-10-CM | POA: Diagnosis not present

## 2021-03-14 DIAGNOSIS — M542 Cervicalgia: Secondary | ICD-10-CM | POA: Diagnosis not present

## 2021-03-14 DIAGNOSIS — G8929 Other chronic pain: Secondary | ICD-10-CM | POA: Diagnosis not present

## 2021-03-14 DIAGNOSIS — M25512 Pain in left shoulder: Secondary | ICD-10-CM | POA: Diagnosis not present

## 2021-03-18 DIAGNOSIS — M25512 Pain in left shoulder: Secondary | ICD-10-CM | POA: Diagnosis not present

## 2021-03-20 DIAGNOSIS — M25512 Pain in left shoulder: Secondary | ICD-10-CM | POA: Diagnosis not present

## 2021-03-26 DIAGNOSIS — G8929 Other chronic pain: Secondary | ICD-10-CM | POA: Diagnosis not present

## 2021-03-26 DIAGNOSIS — M542 Cervicalgia: Secondary | ICD-10-CM | POA: Diagnosis not present

## 2021-03-26 DIAGNOSIS — M25512 Pain in left shoulder: Secondary | ICD-10-CM | POA: Diagnosis not present

## 2021-04-01 DIAGNOSIS — N400 Enlarged prostate without lower urinary tract symptoms: Secondary | ICD-10-CM | POA: Diagnosis not present

## 2021-04-02 DIAGNOSIS — E1122 Type 2 diabetes mellitus with diabetic chronic kidney disease: Secondary | ICD-10-CM | POA: Diagnosis not present

## 2021-04-02 DIAGNOSIS — E782 Mixed hyperlipidemia: Secondary | ICD-10-CM | POA: Diagnosis not present

## 2021-04-02 DIAGNOSIS — D61818 Other pancytopenia: Secondary | ICD-10-CM | POA: Diagnosis not present

## 2021-04-02 DIAGNOSIS — N183 Chronic kidney disease, stage 3 unspecified: Secondary | ICD-10-CM | POA: Diagnosis not present

## 2021-04-02 DIAGNOSIS — I152 Hypertension secondary to endocrine disorders: Secondary | ICD-10-CM | POA: Diagnosis not present

## 2021-04-02 DIAGNOSIS — Z0000 Encounter for general adult medical examination without abnormal findings: Secondary | ICD-10-CM | POA: Diagnosis not present

## 2021-04-02 DIAGNOSIS — E119 Type 2 diabetes mellitus without complications: Secondary | ICD-10-CM | POA: Diagnosis not present

## 2021-04-02 DIAGNOSIS — E1159 Type 2 diabetes mellitus with other circulatory complications: Secondary | ICD-10-CM | POA: Diagnosis not present

## 2021-04-02 DIAGNOSIS — G250 Essential tremor: Secondary | ICD-10-CM | POA: Diagnosis not present

## 2021-04-02 DIAGNOSIS — F5101 Primary insomnia: Secondary | ICD-10-CM | POA: Diagnosis not present

## 2021-04-07 DIAGNOSIS — R29898 Other symptoms and signs involving the musculoskeletal system: Secondary | ICD-10-CM | POA: Diagnosis not present

## 2021-04-07 DIAGNOSIS — G8929 Other chronic pain: Secondary | ICD-10-CM | POA: Diagnosis not present

## 2021-04-07 DIAGNOSIS — M25512 Pain in left shoulder: Secondary | ICD-10-CM | POA: Diagnosis not present

## 2021-04-08 DIAGNOSIS — H524 Presbyopia: Secondary | ICD-10-CM | POA: Diagnosis not present

## 2021-04-08 DIAGNOSIS — H5203 Hypermetropia, bilateral: Secondary | ICD-10-CM | POA: Diagnosis not present

## 2021-04-08 DIAGNOSIS — H2513 Age-related nuclear cataract, bilateral: Secondary | ICD-10-CM | POA: Diagnosis not present

## 2021-04-08 DIAGNOSIS — H52223 Regular astigmatism, bilateral: Secondary | ICD-10-CM | POA: Diagnosis not present

## 2021-04-11 DIAGNOSIS — G319 Degenerative disease of nervous system, unspecified: Secondary | ICD-10-CM | POA: Diagnosis not present

## 2021-04-14 DIAGNOSIS — R29898 Other symptoms and signs involving the musculoskeletal system: Secondary | ICD-10-CM | POA: Diagnosis not present

## 2021-04-14 DIAGNOSIS — M25512 Pain in left shoulder: Secondary | ICD-10-CM | POA: Diagnosis not present

## 2021-04-14 DIAGNOSIS — G8929 Other chronic pain: Secondary | ICD-10-CM | POA: Diagnosis not present

## 2021-04-22 DIAGNOSIS — N400 Enlarged prostate without lower urinary tract symptoms: Secondary | ICD-10-CM | POA: Diagnosis not present

## 2021-04-29 DIAGNOSIS — G500 Trigeminal neuralgia: Secondary | ICD-10-CM | POA: Diagnosis not present

## 2021-04-29 DIAGNOSIS — Z510 Encounter for antineoplastic radiation therapy: Secondary | ICD-10-CM | POA: Diagnosis not present

## 2021-05-14 DIAGNOSIS — L578 Other skin changes due to chronic exposure to nonionizing radiation: Secondary | ICD-10-CM | POA: Diagnosis not present

## 2021-05-14 DIAGNOSIS — L821 Other seborrheic keratosis: Secondary | ICD-10-CM | POA: Diagnosis not present

## 2021-05-14 DIAGNOSIS — L570 Actinic keratosis: Secondary | ICD-10-CM | POA: Diagnosis not present

## 2021-05-14 DIAGNOSIS — D1801 Hemangioma of skin and subcutaneous tissue: Secondary | ICD-10-CM | POA: Diagnosis not present

## 2021-05-28 DIAGNOSIS — D649 Anemia, unspecified: Secondary | ICD-10-CM | POA: Diagnosis not present

## 2021-05-28 DIAGNOSIS — D696 Thrombocytopenia, unspecified: Secondary | ICD-10-CM | POA: Diagnosis not present

## 2021-06-11 DIAGNOSIS — M25562 Pain in left knee: Secondary | ICD-10-CM | POA: Diagnosis not present

## 2021-06-11 DIAGNOSIS — M1712 Unilateral primary osteoarthritis, left knee: Secondary | ICD-10-CM | POA: Diagnosis not present

## 2021-06-20 DIAGNOSIS — M7582 Other shoulder lesions, left shoulder: Secondary | ICD-10-CM | POA: Diagnosis not present

## 2021-07-07 DIAGNOSIS — R251 Tremor, unspecified: Secondary | ICD-10-CM | POA: Diagnosis not present

## 2021-07-07 DIAGNOSIS — F419 Anxiety disorder, unspecified: Secondary | ICD-10-CM | POA: Diagnosis not present

## 2021-07-07 DIAGNOSIS — E119 Type 2 diabetes mellitus without complications: Secondary | ICD-10-CM | POA: Diagnosis not present

## 2021-07-07 DIAGNOSIS — J069 Acute upper respiratory infection, unspecified: Secondary | ICD-10-CM | POA: Diagnosis not present

## 2021-07-07 DIAGNOSIS — E1122 Type 2 diabetes mellitus with diabetic chronic kidney disease: Secondary | ICD-10-CM | POA: Diagnosis not present

## 2021-07-07 DIAGNOSIS — N183 Chronic kidney disease, stage 3 unspecified: Secondary | ICD-10-CM | POA: Diagnosis not present

## 2021-07-15 DIAGNOSIS — N183 Chronic kidney disease, stage 3 unspecified: Secondary | ICD-10-CM | POA: Diagnosis not present

## 2021-07-15 DIAGNOSIS — T50905A Adverse effect of unspecified drugs, medicaments and biological substances, initial encounter: Secondary | ICD-10-CM | POA: Diagnosis not present

## 2021-07-15 DIAGNOSIS — J069 Acute upper respiratory infection, unspecified: Secondary | ICD-10-CM | POA: Diagnosis not present

## 2021-07-15 DIAGNOSIS — E1122 Type 2 diabetes mellitus with diabetic chronic kidney disease: Secondary | ICD-10-CM | POA: Diagnosis not present

## 2021-07-23 DIAGNOSIS — R001 Bradycardia, unspecified: Secondary | ICD-10-CM | POA: Diagnosis not present

## 2021-07-23 DIAGNOSIS — E1159 Type 2 diabetes mellitus with other circulatory complications: Secondary | ICD-10-CM | POA: Diagnosis not present

## 2021-07-23 DIAGNOSIS — R040 Epistaxis: Secondary | ICD-10-CM | POA: Diagnosis not present

## 2021-07-23 DIAGNOSIS — I152 Hypertension secondary to endocrine disorders: Secondary | ICD-10-CM | POA: Diagnosis not present

## 2021-07-23 DIAGNOSIS — R002 Palpitations: Secondary | ICD-10-CM | POA: Diagnosis not present

## 2021-07-23 DIAGNOSIS — R0600 Dyspnea, unspecified: Secondary | ICD-10-CM | POA: Diagnosis not present

## 2021-07-30 DIAGNOSIS — H5203 Hypermetropia, bilateral: Secondary | ICD-10-CM | POA: Diagnosis not present

## 2021-07-30 DIAGNOSIS — H524 Presbyopia: Secondary | ICD-10-CM | POA: Diagnosis not present

## 2021-07-30 DIAGNOSIS — E119 Type 2 diabetes mellitus without complications: Secondary | ICD-10-CM | POA: Diagnosis not present

## 2021-07-30 DIAGNOSIS — H35363 Drusen (degenerative) of macula, bilateral: Secondary | ICD-10-CM | POA: Diagnosis not present

## 2021-07-30 DIAGNOSIS — H52223 Regular astigmatism, bilateral: Secondary | ICD-10-CM | POA: Diagnosis not present

## 2021-07-30 DIAGNOSIS — H2513 Age-related nuclear cataract, bilateral: Secondary | ICD-10-CM | POA: Diagnosis not present

## 2021-08-11 DIAGNOSIS — R002 Palpitations: Secondary | ICD-10-CM | POA: Diagnosis not present

## 2021-08-11 DIAGNOSIS — I152 Hypertension secondary to endocrine disorders: Secondary | ICD-10-CM | POA: Diagnosis not present

## 2021-08-11 DIAGNOSIS — E1159 Type 2 diabetes mellitus with other circulatory complications: Secondary | ICD-10-CM | POA: Diagnosis not present

## 2021-08-11 DIAGNOSIS — E119 Type 2 diabetes mellitus without complications: Secondary | ICD-10-CM | POA: Diagnosis not present

## 2021-08-12 DIAGNOSIS — R002 Palpitations: Secondary | ICD-10-CM | POA: Diagnosis not present

## 2021-08-13 DIAGNOSIS — I493 Ventricular premature depolarization: Secondary | ICD-10-CM | POA: Diagnosis not present

## 2021-08-13 DIAGNOSIS — I491 Atrial premature depolarization: Secondary | ICD-10-CM | POA: Diagnosis not present

## 2021-08-13 DIAGNOSIS — I472 Ventricular tachycardia, unspecified: Secondary | ICD-10-CM | POA: Diagnosis not present

## 2021-08-13 DIAGNOSIS — N400 Enlarged prostate without lower urinary tract symptoms: Secondary | ICD-10-CM | POA: Diagnosis not present

## 2021-08-13 DIAGNOSIS — I471 Supraventricular tachycardia: Secondary | ICD-10-CM | POA: Diagnosis not present

## 2021-08-14 DIAGNOSIS — Z923 Personal history of irradiation: Secondary | ICD-10-CM | POA: Diagnosis not present

## 2021-08-14 DIAGNOSIS — G500 Trigeminal neuralgia: Secondary | ICD-10-CM | POA: Diagnosis not present

## 2021-08-19 DIAGNOSIS — I351 Nonrheumatic aortic (valve) insufficiency: Secondary | ICD-10-CM | POA: Diagnosis not present

## 2021-08-19 DIAGNOSIS — R0600 Dyspnea, unspecified: Secondary | ICD-10-CM | POA: Diagnosis not present

## 2021-08-19 DIAGNOSIS — R002 Palpitations: Secondary | ICD-10-CM | POA: Diagnosis not present

## 2021-08-19 DIAGNOSIS — I152 Hypertension secondary to endocrine disorders: Secondary | ICD-10-CM | POA: Diagnosis not present

## 2021-08-19 DIAGNOSIS — I080 Rheumatic disorders of both mitral and aortic valves: Secondary | ICD-10-CM | POA: Diagnosis not present

## 2021-08-19 DIAGNOSIS — E1159 Type 2 diabetes mellitus with other circulatory complications: Secondary | ICD-10-CM | POA: Diagnosis not present

## 2021-08-19 DIAGNOSIS — I340 Nonrheumatic mitral (valve) insufficiency: Secondary | ICD-10-CM | POA: Diagnosis not present

## 2021-08-25 DIAGNOSIS — E1159 Type 2 diabetes mellitus with other circulatory complications: Secondary | ICD-10-CM | POA: Diagnosis not present

## 2021-08-25 DIAGNOSIS — I471 Supraventricular tachycardia: Secondary | ICD-10-CM | POA: Diagnosis not present

## 2021-08-25 DIAGNOSIS — I152 Hypertension secondary to endocrine disorders: Secondary | ICD-10-CM | POA: Diagnosis not present

## 2021-08-25 DIAGNOSIS — R001 Bradycardia, unspecified: Secondary | ICD-10-CM | POA: Diagnosis not present

## 2021-09-22 DIAGNOSIS — E782 Mixed hyperlipidemia: Secondary | ICD-10-CM | POA: Diagnosis not present

## 2021-09-22 DIAGNOSIS — I472 Ventricular tachycardia, unspecified: Secondary | ICD-10-CM | POA: Diagnosis not present

## 2021-09-22 DIAGNOSIS — I471 Supraventricular tachycardia: Secondary | ICD-10-CM | POA: Diagnosis not present

## 2021-09-22 DIAGNOSIS — R002 Palpitations: Secondary | ICD-10-CM | POA: Diagnosis not present

## 2021-09-22 DIAGNOSIS — I10 Essential (primary) hypertension: Secondary | ICD-10-CM | POA: Diagnosis not present

## 2021-09-22 DIAGNOSIS — R001 Bradycardia, unspecified: Secondary | ICD-10-CM | POA: Diagnosis not present

## 2021-09-23 DIAGNOSIS — I493 Ventricular premature depolarization: Secondary | ICD-10-CM | POA: Diagnosis not present

## 2021-09-25 DIAGNOSIS — N5089 Other specified disorders of the male genital organs: Secondary | ICD-10-CM | POA: Diagnosis not present

## 2021-10-16 DIAGNOSIS — N183 Chronic kidney disease, stage 3 unspecified: Secondary | ICD-10-CM | POA: Diagnosis not present

## 2021-10-16 DIAGNOSIS — M1711 Unilateral primary osteoarthritis, right knee: Secondary | ICD-10-CM | POA: Diagnosis not present

## 2021-10-16 DIAGNOSIS — E1122 Type 2 diabetes mellitus with diabetic chronic kidney disease: Secondary | ICD-10-CM | POA: Diagnosis not present

## 2021-10-16 DIAGNOSIS — M7742 Metatarsalgia, left foot: Secondary | ICD-10-CM | POA: Diagnosis not present

## 2021-10-16 DIAGNOSIS — R001 Bradycardia, unspecified: Secondary | ICD-10-CM | POA: Diagnosis not present

## 2021-10-16 DIAGNOSIS — F5101 Primary insomnia: Secondary | ICD-10-CM | POA: Diagnosis not present

## 2021-10-24 DIAGNOSIS — M7582 Other shoulder lesions, left shoulder: Secondary | ICD-10-CM | POA: Diagnosis not present

## 2021-10-24 DIAGNOSIS — M1711 Unilateral primary osteoarthritis, right knee: Secondary | ICD-10-CM | POA: Diagnosis not present

## 2021-10-24 DIAGNOSIS — M25561 Pain in right knee: Secondary | ICD-10-CM | POA: Diagnosis not present

## 2021-10-30 DIAGNOSIS — R001 Bradycardia, unspecified: Secondary | ICD-10-CM | POA: Diagnosis not present

## 2021-10-30 DIAGNOSIS — I472 Ventricular tachycardia, unspecified: Secondary | ICD-10-CM | POA: Diagnosis not present

## 2021-10-30 DIAGNOSIS — R002 Palpitations: Secondary | ICD-10-CM | POA: Diagnosis not present

## 2021-10-30 DIAGNOSIS — I10 Essential (primary) hypertension: Secondary | ICD-10-CM | POA: Diagnosis not present

## 2021-10-30 DIAGNOSIS — I471 Supraventricular tachycardia: Secondary | ICD-10-CM | POA: Diagnosis not present

## 2021-11-07 DIAGNOSIS — N183 Chronic kidney disease, stage 3 unspecified: Secondary | ICD-10-CM | POA: Diagnosis not present

## 2021-11-07 DIAGNOSIS — E1122 Type 2 diabetes mellitus with diabetic chronic kidney disease: Secondary | ICD-10-CM | POA: Diagnosis not present

## 2021-11-07 DIAGNOSIS — I471 Supraventricular tachycardia: Secondary | ICD-10-CM | POA: Diagnosis not present

## 2021-11-07 DIAGNOSIS — I152 Hypertension secondary to endocrine disorders: Secondary | ICD-10-CM | POA: Diagnosis not present

## 2021-11-07 DIAGNOSIS — E1159 Type 2 diabetes mellitus with other circulatory complications: Secondary | ICD-10-CM | POA: Diagnosis not present

## 2021-11-07 DIAGNOSIS — D696 Thrombocytopenia, unspecified: Secondary | ICD-10-CM | POA: Diagnosis not present

## 2021-11-07 DIAGNOSIS — R002 Palpitations: Secondary | ICD-10-CM | POA: Diagnosis not present

## 2021-11-07 DIAGNOSIS — G459 Transient cerebral ischemic attack, unspecified: Secondary | ICD-10-CM | POA: Diagnosis not present

## 2021-11-07 DIAGNOSIS — E782 Mixed hyperlipidemia: Secondary | ICD-10-CM | POA: Diagnosis not present

## 2021-11-07 DIAGNOSIS — R001 Bradycardia, unspecified: Secondary | ICD-10-CM | POA: Diagnosis not present

## 2021-11-12 DIAGNOSIS — M1711 Unilateral primary osteoarthritis, right knee: Secondary | ICD-10-CM | POA: Diagnosis not present

## 2021-11-12 DIAGNOSIS — M8700 Idiopathic aseptic necrosis of unspecified bone: Secondary | ICD-10-CM | POA: Diagnosis not present

## 2021-11-14 DIAGNOSIS — M722 Plantar fascial fibromatosis: Secondary | ICD-10-CM | POA: Diagnosis not present

## 2021-11-14 DIAGNOSIS — M7742 Metatarsalgia, left foot: Secondary | ICD-10-CM | POA: Diagnosis not present

## 2021-11-19 DIAGNOSIS — M1711 Unilateral primary osteoarthritis, right knee: Secondary | ICD-10-CM | POA: Diagnosis not present

## 2021-11-19 DIAGNOSIS — M948X6 Other specified disorders of cartilage, lower leg: Secondary | ICD-10-CM | POA: Diagnosis not present

## 2021-11-19 DIAGNOSIS — M19012 Primary osteoarthritis, left shoulder: Secondary | ICD-10-CM | POA: Diagnosis not present

## 2021-11-19 DIAGNOSIS — M7121 Synovial cyst of popliteal space [Baker], right knee: Secondary | ICD-10-CM | POA: Diagnosis not present

## 2021-11-19 DIAGNOSIS — M24112 Other articular cartilage disorders, left shoulder: Secondary | ICD-10-CM | POA: Diagnosis not present

## 2021-11-19 DIAGNOSIS — M23341 Other meniscus derangements, anterior horn of lateral meniscus, right knee: Secondary | ICD-10-CM | POA: Diagnosis not present

## 2021-11-19 DIAGNOSIS — M8700 Idiopathic aseptic necrosis of unspecified bone: Secondary | ICD-10-CM | POA: Diagnosis not present

## 2021-11-19 DIAGNOSIS — M75102 Unspecified rotator cuff tear or rupture of left shoulder, not specified as traumatic: Secondary | ICD-10-CM | POA: Diagnosis not present

## 2021-11-19 DIAGNOSIS — M65812 Other synovitis and tenosynovitis, left shoulder: Secondary | ICD-10-CM | POA: Diagnosis not present

## 2021-11-19 DIAGNOSIS — M75112 Incomplete rotator cuff tear or rupture of left shoulder, not specified as traumatic: Secondary | ICD-10-CM | POA: Diagnosis not present

## 2021-11-20 DIAGNOSIS — N5089 Other specified disorders of the male genital organs: Secondary | ICD-10-CM | POA: Diagnosis not present

## 2021-11-28 DIAGNOSIS — M75122 Complete rotator cuff tear or rupture of left shoulder, not specified as traumatic: Secondary | ICD-10-CM | POA: Diagnosis not present

## 2021-12-03 DIAGNOSIS — M1711 Unilateral primary osteoarthritis, right knee: Secondary | ICD-10-CM | POA: Diagnosis not present

## 2021-12-08 DIAGNOSIS — I493 Ventricular premature depolarization: Secondary | ICD-10-CM | POA: Diagnosis not present

## 2021-12-08 DIAGNOSIS — I495 Sick sinus syndrome: Secondary | ICD-10-CM | POA: Diagnosis not present

## 2021-12-08 DIAGNOSIS — E1122 Type 2 diabetes mellitus with diabetic chronic kidney disease: Secondary | ICD-10-CM | POA: Diagnosis not present

## 2021-12-08 DIAGNOSIS — R001 Bradycardia, unspecified: Secondary | ICD-10-CM | POA: Diagnosis not present

## 2021-12-08 DIAGNOSIS — I2510 Atherosclerotic heart disease of native coronary artery without angina pectoris: Secondary | ICD-10-CM | POA: Diagnosis not present

## 2021-12-08 DIAGNOSIS — I471 Supraventricular tachycardia: Secondary | ICD-10-CM | POA: Diagnosis not present

## 2021-12-08 DIAGNOSIS — E1121 Type 2 diabetes mellitus with diabetic nephropathy: Secondary | ICD-10-CM | POA: Diagnosis not present

## 2021-12-19 DIAGNOSIS — Z45018 Encounter for adjustment and management of other part of cardiac pacemaker: Secondary | ICD-10-CM | POA: Diagnosis not present

## 2021-12-19 DIAGNOSIS — I495 Sick sinus syndrome: Secondary | ICD-10-CM | POA: Diagnosis not present

## 2021-12-19 DIAGNOSIS — I152 Hypertension secondary to endocrine disorders: Secondary | ICD-10-CM | POA: Diagnosis not present

## 2021-12-19 DIAGNOSIS — E782 Mixed hyperlipidemia: Secondary | ICD-10-CM | POA: Diagnosis not present

## 2021-12-19 DIAGNOSIS — E1159 Type 2 diabetes mellitus with other circulatory complications: Secondary | ICD-10-CM | POA: Diagnosis not present

## 2021-12-19 DIAGNOSIS — I440 Atrioventricular block, first degree: Secondary | ICD-10-CM | POA: Diagnosis not present

## 2021-12-19 DIAGNOSIS — F5101 Primary insomnia: Secondary | ICD-10-CM | POA: Diagnosis not present

## 2021-12-19 DIAGNOSIS — K219 Gastro-esophageal reflux disease without esophagitis: Secondary | ICD-10-CM | POA: Diagnosis not present

## 2021-12-28 DIAGNOSIS — I498 Other specified cardiac arrhythmias: Secondary | ICD-10-CM | POA: Diagnosis not present

## 2022-01-16 DIAGNOSIS — M1711 Unilateral primary osteoarthritis, right knee: Secondary | ICD-10-CM | POA: Diagnosis not present

## 2022-01-20 DIAGNOSIS — E1159 Type 2 diabetes mellitus with other circulatory complications: Secondary | ICD-10-CM | POA: Diagnosis not present

## 2022-01-20 DIAGNOSIS — D696 Thrombocytopenia, unspecified: Secondary | ICD-10-CM | POA: Diagnosis not present

## 2022-01-20 DIAGNOSIS — I152 Hypertension secondary to endocrine disorders: Secondary | ICD-10-CM | POA: Diagnosis not present

## 2022-01-20 DIAGNOSIS — E782 Mixed hyperlipidemia: Secondary | ICD-10-CM | POA: Diagnosis not present

## 2022-02-27 DIAGNOSIS — M1711 Unilateral primary osteoarthritis, right knee: Secondary | ICD-10-CM | POA: Diagnosis not present

## 2022-03-04 DIAGNOSIS — Z125 Encounter for screening for malignant neoplasm of prostate: Secondary | ICD-10-CM | POA: Diagnosis not present

## 2022-03-04 DIAGNOSIS — N401 Enlarged prostate with lower urinary tract symptoms: Secondary | ICD-10-CM | POA: Diagnosis not present

## 2022-03-04 DIAGNOSIS — N5089 Other specified disorders of the male genital organs: Secondary | ICD-10-CM | POA: Diagnosis not present

## 2022-03-04 DIAGNOSIS — R351 Nocturia: Secondary | ICD-10-CM | POA: Diagnosis not present

## 2022-03-04 DIAGNOSIS — N400 Enlarged prostate without lower urinary tract symptoms: Secondary | ICD-10-CM | POA: Diagnosis not present

## 2022-03-05 DIAGNOSIS — M7121 Synovial cyst of popliteal space [Baker], right knee: Secondary | ICD-10-CM | POA: Diagnosis not present

## 2022-03-05 DIAGNOSIS — M1711 Unilateral primary osteoarthritis, right knee: Secondary | ICD-10-CM | POA: Diagnosis not present

## 2022-03-15 DIAGNOSIS — Z45018 Encounter for adjustment and management of other part of cardiac pacemaker: Secondary | ICD-10-CM | POA: Diagnosis not present

## 2022-03-15 DIAGNOSIS — I495 Sick sinus syndrome: Secondary | ICD-10-CM | POA: Diagnosis not present

## 2022-03-17 DIAGNOSIS — I071 Rheumatic tricuspid insufficiency: Secondary | ICD-10-CM | POA: Diagnosis not present

## 2022-03-23 DIAGNOSIS — Z45018 Encounter for adjustment and management of other part of cardiac pacemaker: Secondary | ICD-10-CM | POA: Diagnosis not present

## 2022-03-23 DIAGNOSIS — E1159 Type 2 diabetes mellitus with other circulatory complications: Secondary | ICD-10-CM | POA: Diagnosis not present

## 2022-03-23 DIAGNOSIS — I471 Supraventricular tachycardia, unspecified: Secondary | ICD-10-CM | POA: Diagnosis not present

## 2022-03-23 DIAGNOSIS — R001 Bradycardia, unspecified: Secondary | ICD-10-CM | POA: Diagnosis not present

## 2022-03-23 DIAGNOSIS — I152 Hypertension secondary to endocrine disorders: Secondary | ICD-10-CM | POA: Diagnosis not present

## 2022-03-23 DIAGNOSIS — G459 Transient cerebral ischemic attack, unspecified: Secondary | ICD-10-CM | POA: Diagnosis not present

## 2022-03-23 DIAGNOSIS — E782 Mixed hyperlipidemia: Secondary | ICD-10-CM | POA: Diagnosis not present

## 2022-03-23 DIAGNOSIS — N183 Chronic kidney disease, stage 3 unspecified: Secondary | ICD-10-CM | POA: Diagnosis not present

## 2022-03-23 DIAGNOSIS — E1122 Type 2 diabetes mellitus with diabetic chronic kidney disease: Secondary | ICD-10-CM | POA: Diagnosis not present

## 2022-04-08 DIAGNOSIS — M1711 Unilateral primary osteoarthritis, right knee: Secondary | ICD-10-CM | POA: Diagnosis not present

## 2022-04-08 DIAGNOSIS — M7121 Synovial cyst of popliteal space [Baker], right knee: Secondary | ICD-10-CM | POA: Diagnosis not present

## 2022-04-20 DIAGNOSIS — U071 COVID-19: Secondary | ICD-10-CM | POA: Diagnosis not present

## 2022-05-18 DIAGNOSIS — L578 Other skin changes due to chronic exposure to nonionizing radiation: Secondary | ICD-10-CM | POA: Diagnosis not present

## 2022-05-18 DIAGNOSIS — L821 Other seborrheic keratosis: Secondary | ICD-10-CM | POA: Diagnosis not present

## 2022-05-18 DIAGNOSIS — Q825 Congenital non-neoplastic nevus: Secondary | ICD-10-CM | POA: Diagnosis not present

## 2022-05-18 DIAGNOSIS — L570 Actinic keratosis: Secondary | ICD-10-CM | POA: Diagnosis not present

## 2022-05-22 DIAGNOSIS — M1711 Unilateral primary osteoarthritis, right knee: Secondary | ICD-10-CM | POA: Diagnosis not present

## 2022-05-25 DIAGNOSIS — Z0000 Encounter for general adult medical examination without abnormal findings: Secondary | ICD-10-CM | POA: Diagnosis not present

## 2022-05-25 DIAGNOSIS — E1159 Type 2 diabetes mellitus with other circulatory complications: Secondary | ICD-10-CM | POA: Diagnosis not present

## 2022-05-25 DIAGNOSIS — G500 Trigeminal neuralgia: Secondary | ICD-10-CM | POA: Diagnosis not present

## 2022-05-25 DIAGNOSIS — F5101 Primary insomnia: Secondary | ICD-10-CM | POA: Diagnosis not present

## 2022-05-25 DIAGNOSIS — I152 Hypertension secondary to endocrine disorders: Secondary | ICD-10-CM | POA: Diagnosis not present

## 2022-05-25 DIAGNOSIS — E1122 Type 2 diabetes mellitus with diabetic chronic kidney disease: Secondary | ICD-10-CM | POA: Diagnosis not present

## 2022-05-25 DIAGNOSIS — D696 Thrombocytopenia, unspecified: Secondary | ICD-10-CM | POA: Diagnosis not present

## 2022-05-25 DIAGNOSIS — N183 Chronic kidney disease, stage 3 unspecified: Secondary | ICD-10-CM | POA: Diagnosis not present

## 2022-05-25 DIAGNOSIS — E119 Type 2 diabetes mellitus without complications: Secondary | ICD-10-CM | POA: Diagnosis not present

## 2022-06-05 DIAGNOSIS — N400 Enlarged prostate without lower urinary tract symptoms: Secondary | ICD-10-CM | POA: Diagnosis not present

## 2022-06-11 DIAGNOSIS — D649 Anemia, unspecified: Secondary | ICD-10-CM | POA: Diagnosis not present

## 2022-06-11 DIAGNOSIS — D696 Thrombocytopenia, unspecified: Secondary | ICD-10-CM | POA: Diagnosis not present

## 2022-06-23 DIAGNOSIS — Z01818 Encounter for other preprocedural examination: Secondary | ICD-10-CM | POA: Diagnosis not present

## 2022-06-23 DIAGNOSIS — Z01812 Encounter for preprocedural laboratory examination: Secondary | ICD-10-CM | POA: Diagnosis not present

## 2022-06-23 DIAGNOSIS — M1711 Unilateral primary osteoarthritis, right knee: Secondary | ICD-10-CM | POA: Diagnosis not present

## 2022-06-23 DIAGNOSIS — Z79899 Other long term (current) drug therapy: Secondary | ICD-10-CM | POA: Diagnosis not present

## 2022-06-23 DIAGNOSIS — R9431 Abnormal electrocardiogram [ECG] [EKG]: Secondary | ICD-10-CM | POA: Diagnosis not present

## 2022-06-23 DIAGNOSIS — M25561 Pain in right knee: Secondary | ICD-10-CM | POA: Diagnosis not present

## 2022-06-23 DIAGNOSIS — Z01810 Encounter for preprocedural cardiovascular examination: Secondary | ICD-10-CM | POA: Diagnosis not present

## 2022-06-29 DIAGNOSIS — N183 Chronic kidney disease, stage 3 unspecified: Secondary | ICD-10-CM | POA: Diagnosis not present

## 2022-06-29 DIAGNOSIS — I2510 Atherosclerotic heart disease of native coronary artery without angina pectoris: Secondary | ICD-10-CM | POA: Diagnosis not present

## 2022-06-29 DIAGNOSIS — I10 Essential (primary) hypertension: Secondary | ICD-10-CM | POA: Diagnosis not present

## 2022-07-01 DIAGNOSIS — M1711 Unilateral primary osteoarthritis, right knee: Secondary | ICD-10-CM | POA: Diagnosis not present

## 2022-07-08 DIAGNOSIS — M1711 Unilateral primary osteoarthritis, right knee: Secondary | ICD-10-CM | POA: Diagnosis not present

## 2022-07-13 DIAGNOSIS — G8918 Other acute postprocedural pain: Secondary | ICD-10-CM | POA: Diagnosis not present

## 2022-07-13 DIAGNOSIS — E1122 Type 2 diabetes mellitus with diabetic chronic kidney disease: Secondary | ICD-10-CM | POA: Diagnosis not present

## 2022-07-13 DIAGNOSIS — I129 Hypertensive chronic kidney disease with stage 1 through stage 4 chronic kidney disease, or unspecified chronic kidney disease: Secondary | ICD-10-CM | POA: Diagnosis not present

## 2022-07-13 DIAGNOSIS — N183 Chronic kidney disease, stage 3 unspecified: Secondary | ICD-10-CM | POA: Diagnosis not present

## 2022-07-13 DIAGNOSIS — M1711 Unilateral primary osteoarthritis, right knee: Secondary | ICD-10-CM | POA: Diagnosis not present

## 2022-07-16 DIAGNOSIS — M1711 Unilateral primary osteoarthritis, right knee: Secondary | ICD-10-CM | POA: Diagnosis not present

## 2022-07-20 DIAGNOSIS — M1711 Unilateral primary osteoarthritis, right knee: Secondary | ICD-10-CM | POA: Diagnosis not present

## 2022-07-23 DIAGNOSIS — M1711 Unilateral primary osteoarthritis, right knee: Secondary | ICD-10-CM | POA: Diagnosis not present

## 2022-07-27 DIAGNOSIS — M1711 Unilateral primary osteoarthritis, right knee: Secondary | ICD-10-CM | POA: Diagnosis not present

## 2022-07-29 DIAGNOSIS — M25461 Effusion, right knee: Secondary | ICD-10-CM | POA: Diagnosis not present

## 2022-07-29 DIAGNOSIS — Z96651 Presence of right artificial knee joint: Secondary | ICD-10-CM | POA: Diagnosis not present

## 2022-07-30 DIAGNOSIS — M1711 Unilateral primary osteoarthritis, right knee: Secondary | ICD-10-CM | POA: Diagnosis not present

## 2022-08-03 DIAGNOSIS — M1711 Unilateral primary osteoarthritis, right knee: Secondary | ICD-10-CM | POA: Diagnosis not present

## 2022-08-06 DIAGNOSIS — N183 Chronic kidney disease, stage 3 unspecified: Secondary | ICD-10-CM | POA: Diagnosis not present

## 2022-08-06 DIAGNOSIS — I10 Essential (primary) hypertension: Secondary | ICD-10-CM | POA: Diagnosis not present

## 2022-08-06 DIAGNOSIS — M1711 Unilateral primary osteoarthritis, right knee: Secondary | ICD-10-CM | POA: Diagnosis not present

## 2022-08-07 DIAGNOSIS — Z96651 Presence of right artificial knee joint: Secondary | ICD-10-CM | POA: Diagnosis not present

## 2022-08-07 DIAGNOSIS — M7989 Other specified soft tissue disorders: Secondary | ICD-10-CM | POA: Diagnosis not present

## 2022-08-10 DIAGNOSIS — M1711 Unilateral primary osteoarthritis, right knee: Secondary | ICD-10-CM | POA: Diagnosis not present

## 2022-08-13 DIAGNOSIS — M1711 Unilateral primary osteoarthritis, right knee: Secondary | ICD-10-CM | POA: Diagnosis not present

## 2022-08-18 DIAGNOSIS — M1711 Unilateral primary osteoarthritis, right knee: Secondary | ICD-10-CM | POA: Diagnosis not present

## 2022-08-20 DIAGNOSIS — M1711 Unilateral primary osteoarthritis, right knee: Secondary | ICD-10-CM | POA: Diagnosis not present

## 2022-08-24 DIAGNOSIS — M1711 Unilateral primary osteoarthritis, right knee: Secondary | ICD-10-CM | POA: Diagnosis not present

## 2022-08-26 DIAGNOSIS — M1711 Unilateral primary osteoarthritis, right knee: Secondary | ICD-10-CM | POA: Diagnosis not present

## 2022-08-31 DIAGNOSIS — M1711 Unilateral primary osteoarthritis, right knee: Secondary | ICD-10-CM | POA: Diagnosis not present

## 2022-09-02 DIAGNOSIS — M1711 Unilateral primary osteoarthritis, right knee: Secondary | ICD-10-CM | POA: Diagnosis not present

## 2022-09-07 DIAGNOSIS — M1711 Unilateral primary osteoarthritis, right knee: Secondary | ICD-10-CM | POA: Diagnosis not present

## 2022-09-08 DIAGNOSIS — Z45010 Encounter for checking and testing of cardiac pacemaker pulse generator [battery]: Secondary | ICD-10-CM | POA: Diagnosis not present

## 2022-09-08 DIAGNOSIS — E113293 Type 2 diabetes mellitus with mild nonproliferative diabetic retinopathy without macular edema, bilateral: Secondary | ICD-10-CM | POA: Diagnosis not present

## 2022-09-08 DIAGNOSIS — H2513 Age-related nuclear cataract, bilateral: Secondary | ICD-10-CM | POA: Diagnosis not present

## 2022-09-08 DIAGNOSIS — I495 Sick sinus syndrome: Secondary | ICD-10-CM | POA: Diagnosis not present

## 2022-09-08 DIAGNOSIS — H35363 Drusen (degenerative) of macula, bilateral: Secondary | ICD-10-CM | POA: Diagnosis not present

## 2022-09-09 DIAGNOSIS — M1711 Unilateral primary osteoarthritis, right knee: Secondary | ICD-10-CM | POA: Diagnosis not present

## 2022-09-15 DIAGNOSIS — M79651 Pain in right thigh: Secondary | ICD-10-CM | POA: Diagnosis not present

## 2022-09-15 DIAGNOSIS — R6889 Other general symptoms and signs: Secondary | ICD-10-CM | POA: Diagnosis not present

## 2022-09-15 DIAGNOSIS — Z96651 Presence of right artificial knee joint: Secondary | ICD-10-CM | POA: Diagnosis not present

## 2022-09-15 DIAGNOSIS — R293 Abnormal posture: Secondary | ICD-10-CM | POA: Diagnosis not present

## 2022-09-15 DIAGNOSIS — Z471 Aftercare following joint replacement surgery: Secondary | ICD-10-CM | POA: Diagnosis not present

## 2022-09-22 DIAGNOSIS — Z471 Aftercare following joint replacement surgery: Secondary | ICD-10-CM | POA: Diagnosis not present

## 2022-09-22 DIAGNOSIS — Z96651 Presence of right artificial knee joint: Secondary | ICD-10-CM | POA: Diagnosis not present

## 2022-09-22 DIAGNOSIS — M79651 Pain in right thigh: Secondary | ICD-10-CM | POA: Diagnosis not present

## 2022-09-22 DIAGNOSIS — R293 Abnormal posture: Secondary | ICD-10-CM | POA: Diagnosis not present

## 2022-09-22 DIAGNOSIS — R6889 Other general symptoms and signs: Secondary | ICD-10-CM | POA: Diagnosis not present

## 2022-09-24 DIAGNOSIS — E1159 Type 2 diabetes mellitus with other circulatory complications: Secondary | ICD-10-CM | POA: Diagnosis not present

## 2022-09-24 DIAGNOSIS — I152 Hypertension secondary to endocrine disorders: Secondary | ICD-10-CM | POA: Diagnosis not present

## 2022-09-24 DIAGNOSIS — E782 Mixed hyperlipidemia: Secondary | ICD-10-CM | POA: Diagnosis not present

## 2022-09-24 DIAGNOSIS — D649 Anemia, unspecified: Secondary | ICD-10-CM | POA: Diagnosis not present

## 2022-09-24 DIAGNOSIS — F5101 Primary insomnia: Secondary | ICD-10-CM | POA: Diagnosis not present

## 2022-09-24 DIAGNOSIS — I471 Supraventricular tachycardia, unspecified: Secondary | ICD-10-CM | POA: Diagnosis not present

## 2022-10-01 DIAGNOSIS — Z96651 Presence of right artificial knee joint: Secondary | ICD-10-CM | POA: Diagnosis not present

## 2022-10-01 DIAGNOSIS — M79651 Pain in right thigh: Secondary | ICD-10-CM | POA: Diagnosis not present

## 2022-10-01 DIAGNOSIS — R293 Abnormal posture: Secondary | ICD-10-CM | POA: Diagnosis not present

## 2022-10-01 DIAGNOSIS — R6889 Other general symptoms and signs: Secondary | ICD-10-CM | POA: Diagnosis not present

## 2022-10-01 DIAGNOSIS — Z471 Aftercare following joint replacement surgery: Secondary | ICD-10-CM | POA: Diagnosis not present

## 2022-10-07 DIAGNOSIS — R200 Anesthesia of skin: Secondary | ICD-10-CM | POA: Diagnosis not present

## 2022-10-07 DIAGNOSIS — G500 Trigeminal neuralgia: Secondary | ICD-10-CM | POA: Diagnosis not present

## 2022-10-07 DIAGNOSIS — G459 Transient cerebral ischemic attack, unspecified: Secondary | ICD-10-CM | POA: Diagnosis not present

## 2022-10-19 DIAGNOSIS — N5089 Other specified disorders of the male genital organs: Secondary | ICD-10-CM | POA: Diagnosis not present

## 2022-11-17 DIAGNOSIS — Z471 Aftercare following joint replacement surgery: Secondary | ICD-10-CM | POA: Diagnosis not present

## 2022-11-17 DIAGNOSIS — R293 Abnormal posture: Secondary | ICD-10-CM | POA: Diagnosis not present

## 2022-11-17 DIAGNOSIS — Z96651 Presence of right artificial knee joint: Secondary | ICD-10-CM | POA: Diagnosis not present

## 2022-11-17 DIAGNOSIS — R6889 Other general symptoms and signs: Secondary | ICD-10-CM | POA: Diagnosis not present

## 2022-11-17 DIAGNOSIS — M79651 Pain in right thigh: Secondary | ICD-10-CM | POA: Diagnosis not present

## 2022-12-08 DIAGNOSIS — Z008 Encounter for other general examination: Secondary | ICD-10-CM | POA: Diagnosis not present

## 2022-12-11 DIAGNOSIS — I471 Supraventricular tachycardia, unspecified: Secondary | ICD-10-CM | POA: Diagnosis not present

## 2022-12-11 DIAGNOSIS — Z45018 Encounter for adjustment and management of other part of cardiac pacemaker: Secondary | ICD-10-CM | POA: Diagnosis not present

## 2022-12-11 DIAGNOSIS — E1122 Type 2 diabetes mellitus with diabetic chronic kidney disease: Secondary | ICD-10-CM | POA: Diagnosis not present

## 2022-12-11 DIAGNOSIS — N183 Chronic kidney disease, stage 3 unspecified: Secondary | ICD-10-CM | POA: Diagnosis not present

## 2022-12-11 DIAGNOSIS — G459 Transient cerebral ischemic attack, unspecified: Secondary | ICD-10-CM | POA: Diagnosis not present

## 2022-12-11 DIAGNOSIS — E782 Mixed hyperlipidemia: Secondary | ICD-10-CM | POA: Diagnosis not present

## 2022-12-11 DIAGNOSIS — R001 Bradycardia, unspecified: Secondary | ICD-10-CM | POA: Diagnosis not present

## 2022-12-30 DIAGNOSIS — L821 Other seborrheic keratosis: Secondary | ICD-10-CM | POA: Diagnosis not present

## 2023-01-01 DIAGNOSIS — I10 Essential (primary) hypertension: Secondary | ICD-10-CM | POA: Diagnosis not present

## 2023-01-01 DIAGNOSIS — N183 Chronic kidney disease, stage 3 unspecified: Secondary | ICD-10-CM | POA: Diagnosis not present

## 2023-01-18 DIAGNOSIS — N400 Enlarged prostate without lower urinary tract symptoms: Secondary | ICD-10-CM | POA: Diagnosis not present

## 2023-01-18 DIAGNOSIS — N5089 Other specified disorders of the male genital organs: Secondary | ICD-10-CM | POA: Diagnosis not present

## 2023-01-21 DIAGNOSIS — D649 Anemia, unspecified: Secondary | ICD-10-CM | POA: Diagnosis not present

## 2023-01-21 DIAGNOSIS — D696 Thrombocytopenia, unspecified: Secondary | ICD-10-CM | POA: Diagnosis not present

## 2023-01-21 DIAGNOSIS — D61818 Other pancytopenia: Secondary | ICD-10-CM | POA: Diagnosis not present

## 2023-03-03 DIAGNOSIS — M1712 Unilateral primary osteoarthritis, left knee: Secondary | ICD-10-CM | POA: Diagnosis not present

## 2023-03-03 DIAGNOSIS — M170 Bilateral primary osteoarthritis of knee: Secondary | ICD-10-CM | POA: Diagnosis not present

## 2023-03-03 DIAGNOSIS — Z96651 Presence of right artificial knee joint: Secondary | ICD-10-CM | POA: Diagnosis not present

## 2023-03-03 DIAGNOSIS — M25562 Pain in left knee: Secondary | ICD-10-CM | POA: Diagnosis not present

## 2023-03-09 DIAGNOSIS — R001 Bradycardia, unspecified: Secondary | ICD-10-CM | POA: Diagnosis not present

## 2023-03-09 DIAGNOSIS — Z45018 Encounter for adjustment and management of other part of cardiac pacemaker: Secondary | ICD-10-CM | POA: Diagnosis not present

## 2023-03-26 DIAGNOSIS — Z45018 Encounter for adjustment and management of other part of cardiac pacemaker: Secondary | ICD-10-CM | POA: Diagnosis not present

## 2023-03-26 DIAGNOSIS — R001 Bradycardia, unspecified: Secondary | ICD-10-CM | POA: Diagnosis not present

## 2023-03-29 DIAGNOSIS — E1159 Type 2 diabetes mellitus with other circulatory complications: Secondary | ICD-10-CM | POA: Diagnosis not present

## 2023-03-29 DIAGNOSIS — E782 Mixed hyperlipidemia: Secondary | ICD-10-CM | POA: Diagnosis not present

## 2023-03-29 DIAGNOSIS — I152 Hypertension secondary to endocrine disorders: Secondary | ICD-10-CM | POA: Diagnosis not present

## 2023-04-22 ENCOUNTER — Encounter (HOSPITAL_BASED_OUTPATIENT_CLINIC_OR_DEPARTMENT_OTHER): Payer: Self-pay | Admitting: *Deleted

## 2023-04-22 ENCOUNTER — Emergency Department (HOSPITAL_BASED_OUTPATIENT_CLINIC_OR_DEPARTMENT_OTHER): Payer: Medicare HMO

## 2023-04-22 ENCOUNTER — Other Ambulatory Visit: Payer: Self-pay

## 2023-04-22 ENCOUNTER — Emergency Department (HOSPITAL_BASED_OUTPATIENT_CLINIC_OR_DEPARTMENT_OTHER)
Admission: EM | Admit: 2023-04-22 | Discharge: 2023-04-22 | Disposition: A | Discharge status: Home / Self Care | Payer: Medicare HMO | Attending: Emergency Medicine | Admitting: Emergency Medicine

## 2023-04-22 DIAGNOSIS — E119 Type 2 diabetes mellitus without complications: Secondary | ICD-10-CM | POA: Insufficient documentation

## 2023-04-22 DIAGNOSIS — Z79899 Other long term (current) drug therapy: Secondary | ICD-10-CM | POA: Diagnosis not present

## 2023-04-22 DIAGNOSIS — Z20822 Contact with and (suspected) exposure to covid-19: Secondary | ICD-10-CM | POA: Diagnosis not present

## 2023-04-22 DIAGNOSIS — R197 Diarrhea, unspecified: Secondary | ICD-10-CM | POA: Diagnosis not present

## 2023-04-22 DIAGNOSIS — Z950 Presence of cardiac pacemaker: Secondary | ICD-10-CM | POA: Insufficient documentation

## 2023-04-22 DIAGNOSIS — R0602 Shortness of breath: Secondary | ICD-10-CM | POA: Diagnosis not present

## 2023-04-22 DIAGNOSIS — I10 Essential (primary) hypertension: Secondary | ICD-10-CM | POA: Insufficient documentation

## 2023-04-22 DIAGNOSIS — Z7984 Long term (current) use of oral hypoglycemic drugs: Secondary | ICD-10-CM | POA: Diagnosis not present

## 2023-04-22 DIAGNOSIS — R002 Palpitations: Secondary | ICD-10-CM | POA: Insufficient documentation

## 2023-04-22 DIAGNOSIS — Z7901 Long term (current) use of anticoagulants: Secondary | ICD-10-CM | POA: Insufficient documentation

## 2023-04-22 LAB — CBC WITH DIFFERENTIAL/PLATELET
Abs Immature Granulocytes: 0.02 10*3/uL (ref 0.00–0.07)
Basophils Absolute: 0 10*3/uL (ref 0.0–0.1)
Basophils Relative: 0 %
Eosinophils Absolute: 0.2 10*3/uL (ref 0.0–0.5)
Eosinophils Relative: 2 %
HCT: 36.9 % — ABNORMAL LOW (ref 39.0–52.0)
Hemoglobin: 12.7 g/dL — ABNORMAL LOW (ref 13.0–17.0)
Immature Granulocytes: 0 %
Lymphocytes Relative: 16 %
Lymphs Abs: 1.1 10*3/uL (ref 0.7–4.0)
MCH: 31.2 pg (ref 26.0–34.0)
MCHC: 34.4 g/dL (ref 30.0–36.0)
MCV: 90.7 fL (ref 80.0–100.0)
Monocytes Absolute: 0.5 10*3/uL (ref 0.1–1.0)
Monocytes Relative: 7 %
Neutro Abs: 5.2 10*3/uL (ref 1.7–7.7)
Neutrophils Relative %: 75 %
Platelets: 163 10*3/uL (ref 150–400)
RBC: 4.07 MIL/uL — ABNORMAL LOW (ref 4.22–5.81)
RDW: 13 % (ref 11.5–15.5)
WBC: 6.9 10*3/uL (ref 4.0–10.5)
nRBC: 0 % (ref 0.0–0.2)

## 2023-04-22 LAB — COMPREHENSIVE METABOLIC PANEL
ALT: 16 U/L (ref 0–44)
AST: 16 U/L (ref 15–41)
Albumin: 4.2 g/dL (ref 3.5–5.0)
Alkaline Phosphatase: 54 U/L (ref 38–126)
Anion gap: 8 (ref 5–15)
BUN: 25 mg/dL — ABNORMAL HIGH (ref 8–23)
CO2: 24 mmol/L (ref 22–32)
Calcium: 9.4 mg/dL (ref 8.9–10.3)
Chloride: 107 mmol/L (ref 98–111)
Creatinine, Ser: 1.6 mg/dL — ABNORMAL HIGH (ref 0.61–1.24)
GFR, Estimated: 44 mL/min — ABNORMAL LOW (ref >60)
Glucose, Bld: 130 mg/dL — ABNORMAL HIGH (ref 70–99)
Potassium: 4.5 mmol/L (ref 3.5–5.1)
Sodium: 139 mmol/L (ref 135–145)
Total Bilirubin: 0.8 mg/dL (ref <1.2)
Total Protein: 6.9 g/dL (ref 6.5–8.1)

## 2023-04-22 LAB — BRAIN NATRIURETIC PEPTIDE: B Natriuretic Peptide: 93.2 pg/mL (ref 0.0–100.0)

## 2023-04-22 LAB — RESP PANEL BY RT-PCR (RSV, FLU A&B, COVID)  RVPGX2
Influenza A by PCR: NEGATIVE
Influenza B by PCR: NEGATIVE
Resp Syncytial Virus by PCR: NEGATIVE
SARS Coronavirus 2 by RT PCR: NEGATIVE

## 2023-04-22 LAB — MAGNESIUM: Magnesium: 1.9 mg/dL (ref 1.7–2.4)

## 2023-04-22 LAB — TROPONIN I (HIGH SENSITIVITY): Troponin I (High Sensitivity): 8 ng/L (ref <18)

## 2023-04-22 NOTE — ED Provider Notes (Signed)
[place] EMERGENCY DEPARTMENT AT [facility] Provider Note   CSN: [id number] Arrival date & time: 04/22/23  1009     History  Chief Complaint  Patient presents with   Shortness of Breath    Nathaniel Davis is a 77 y.o. male.  77 year old male with a history of diabetes, hypertension, hyperlipidemia, sick sinus syndrome status post [employer] pacemaker, NSVT, and SVT who presents to the emergency department with diarrhea and palpitations.  Over the past 4 to 5 days he says that he has been feeling more anxious than usual about selling his home.  Has been feeling occasional skipped beats and says that he feels like he needs to gasp for breath when this happens.  No fever or runny nose or sore throat or cough.  In the past 2 days has been having some loose stools.  Has had 3 episodes of nonbloody nonmelanic stool yesterday.  No abdominal pain.  No abdominal surgeries.  Decided to come into the emergency department because he was feeling anxious.       Home Medications Prior to Admission medications   Medication Sig Start Date End Date Taking? Authorizing Provider  ALPRAZolam Prudy Feeler) 1 MG tablet Take 0.5 mg by mouth See admin instructions. Take 1/2 tablet (0.5 mg) by mouth daily at bedtime, may also take 1/2 tablet 2 times daily as needed for anxiety    [provider]  amoxicillin-clavulanate (AUGMENTIN) 875-125 MG tablet Take 1 tablet by mouth every 12 (twelve) hours. 08/29/17   McDonald, Mia A, PA-C  atenolol (TENORMIN) 50 MG tablet Take 25 mg by mouth daily.     [provider]  carbamazepine (TEGRETOL) 200 MG tablet Take 400 mg by mouth 3 (three) times daily.     [provider]  ciprofloxacin (CIPRO) 500 MG tablet Take 1 tablet (500 mg total) by mouth 2 (two) times daily. One po bid x 7 days 12/19/17   Arby Barrette, MD  clobetasol (TEMOVATE) 0.05 % external solution Apply 1 application topically See admin instructions. Apply to scalp at bedtime  as needed for itching 09/28/13   [provider]  clopidogrel (PLAVIX) 75 MG tablet Take 1 tablet (75 mg total) by mouth daily. 12/09/15   Alison Murray, MD  diphenoxylate-atropine (LOMOTIL) 2.5-0.025 MG per tablet Take 1 tablet by mouth 2 (two) times daily as needed for diarrhea or loose stools.     [provider]  doxepin (SINEQUAN) 25 MG capsule Take 100 mg by mouth at bedtime.     [provider]  erythromycin ophthalmic ointment Place a 1/2 inch ribbon of ointment into the left and the right lower eyelids every 6 hours while you are awake for 5 days. 08/29/17   McDonald, Mia A, PA-C  HYDROcodone-acetaminophen (NORCO) 10-325 MG per tablet Take 1 tablet by mouth every 6 (six) hours as needed (pain).     [provider]  hydrOXYzine (ATARAX/VISTARIL) 25 MG tablet Take 25 mg by mouth 3 (three) times daily as needed for itching.     [provider]  indomethacin (INDOCIN) 50 MG capsule Take 50 mg by mouth 3 (three) times daily with meals.     [provider]  lisinopril (PRINIVIL,ZESTRIL) 5 MG tablet Take 5 mg by mouth daily. 11/28/15   [provider]  meclizine (ANTIVERT) 12.5 MG tablet Take 12.5 mg by mouth 3 (three) times daily. 11/28/15   [provider]  metFORMIN (GLUCOPHAGE) 500 MG tablet Take 500 mg by mouth 3 (  three) times daily with meals.    [provider]  pantoprazole (PROTONIX) 40 MG tablet Take 40 mg by mouth daily after supper.     [provider]  pravastatin (PRAVACHOL) 40 MG tablet Take 1 tablet (40 mg total) by mouth daily. 12/09/15   Alison Murray, MD  promethazine (PHENERGAN) 25 MG suppository Place 25 mg rectally every 6 (six) hours as needed for nausea or vomiting.    [provider]  promethazine (PHENERGAN) 25 MG tablet Take 25 mg by mouth 2 (two) times daily as needed for nausea or vomiting.     [provider]  sertraline (ZOLOFT) 100 MG tablet Take 150 mg by mouth  daily.     [provider]      Allergies    Crestor [rosuvastatin], Lunesta [eszopiclone], Iodinated contrast media, and Sulfa antibiotics    Review of Systems   Review of Systems  Physical Exam Updated Vital Signs BP 132/82   Pulse (!) 59   Temp 97.8 F (36.6 C)   Resp 11   Wt 79.4 kg   SpO2 97%   BMI 25.11 kg/m  Physical Exam Vitals and nursing note reviewed.  Constitutional:      General: He is not in acute distress.    Appearance: He is well-developed.  HENT:     Head: Normocephalic and atraumatic.     Right Ear: External ear normal.     Left Ear: External ear normal.     Nose: Nose normal.  Eyes:     Extraocular Movements: Extraocular movements intact.     Conjunctiva/sclera: Conjunctivae normal.     Pupils: Pupils are equal, round, and reactive to light.  Cardiovascular:     Rate and Rhythm: Normal rate and regular rhythm.     Heart sounds: Normal heart sounds.  Pulmonary:     Effort: Pulmonary effort is normal. No respiratory distress.     Breath sounds: Normal breath sounds.  Abdominal:     General: There is no distension.     Palpations: Abdomen is soft. There is no mass.     Tenderness: There is no abdominal tenderness. There is no guarding.  Musculoskeletal:     Cervical back: Normal range of motion and neck supple.     Right lower leg: No edema.     Left lower leg: No edema.  Skin:    General: Skin is warm and dry.  Neurological:     Mental Status: He is alert. Mental status is at baseline.  Psychiatric:        Mood and Affect: Mood normal.        Behavior: Behavior normal.     ED Results / Procedures / Treatments   Labs (all labs ordered are listed, but only abnormal results are displayed) Labs Reviewed  COMPREHENSIVE METABOLIC PANEL - Abnormal; Notable for the following components:      Result Value   Glucose, Bld 130 (*)    BUN 25 (*)    Creatinine, Ser 1.60 (*)    GFR, Estimated 44 (*)    All other components within normal  limits  CBC WITH DIFFERENTIAL/PLATELET - Abnormal; Notable for the following components:   RBC 4.07 (*)    Hemoglobin 12.7 (*)    HCT 36.9 (*)    All other components within normal limits  RESP PANEL BY RT-PCR (RSV, FLU A&B, COVID)  RVPGX2  BRAIN NATRIURETIC PEPTIDE  MAGNESIUM  TROPONIN I (HIGH SENSITIVITY)  EKG EKG Interpretation Date/Time:  Thursday April 22 2023 10:27:36 EST Ventricular Rate:  61 PR Interval:    QRS Duration:  173 QT Interval:  481 QTC Calculation: 485 R Axis:   -66  Text Interpretation: AV dual-paced rhythm Left bundle branch block Confirmed by Vonita Moss [phone]) on 04/22/2023 10:55:29 AM  Radiology DG Chest 2 View Result Date: 04/22/2023 CLINICAL DATA:  Shortness of breath. EXAM: CHEST - 2 VIEW COMPARISON:  August 29, 2017. FINDINGS: The heart size and mediastinal contours are within normal limits. Left-sided pacemaker is noted with leads in grossly good position. Both lungs are clear. The visualized skeletal structures are unremarkable. IMPRESSION: No active cardiopulmonary disease. Electronically Signed   By: Lupita Raider M.D.   On: 04/22/2023 14:15    Procedures Procedures    Medications Ordered in ED Medications - No data to display  ED Course/ Medical Decision Making/ A&P Clinical Course as of 04/22/23 1947  Thu Apr 22, 2023  1113 Creatinine(!): 1.60 At baseline [RP]  1415 Have tried calling [employer] multiple times without response.  Patient is requesting to go at this time and I feel this is reasonable.  I have reviewed his telemetry and there has not been any sign of arrhythmia and he has been in a paced rhythm the entire time.  Will have him follow-up with his primary doctor and cardiology. [RP]    Clinical Course User Index [RP] Rondel Baton, MD                                 Medical Decision Making Amount and/or Complexity of Data Reviewed Labs: ordered. Decision-making details documented in ED Course. Radiology:  ordered.   Nathaniel Davis is a 77 y.o. male with comorbidities that complicate the patient evaluation including diabetes, hypertension, hyperlipidemia, sick sinus syndrome status post [employer] pacemaker, NSVT, and SVT who presents to the emergency department with diarrhea and palpitations.   Initial Ddx:  Palpitations, arrhythmia, SVT, NSVT, pacemaker malfunction, MI, PE, CHF, URI, electrolyte abnormality  MDM/Course:  Patient presents emergency department with palpitations and shortness of breath.  Also has been having some diarrhea recently.  No significant chest discomfort or other anginal equivalents.  EKG shows a paced rhythm without obvious ischemia.  Initial high-sensitivity troponin WNL.  Since he is not actively having chest pain do not feel that repeat is warranted at this time.  BNP was also WNL along with his electrolytes.  Did have a COVID and flu since sometimes these illnesses can cause shortness of breath along with GI symptoms.  It was negative.  Chest x-ray without pneumonia.  Upon re-evaluation the patient remained in a paced rhythm and did not have any arrhythmias on EKG.  He is overall well-appearing.  Did attempt to interrogate his pacemaker but unfortunately were unable to reach [employer] to get a final read.  Patient was requesting go home which I feel is reasonable.  Will have him follow-up with his cardiologist.  Instructed to take Imodium for his diarrhea.  No significant abdominal tenderness to palpation on exam to suggest any acute process requiring cross-sectional imaging.  This patient presents to the ED for concern of complaints listed in HPI, this involves an extensive number of treatment options, and is a complaint that carries with it a high risk of complications and morbidity. Disposition including potential need for admission considered.   Dispo: DC Home. Return precautions discussed including, but not  limited to, those listed in the AVS. Allowed pt time to ask  questions which were answered fully prior to dc.    Records reviewed Outpatient Clinic Notes The following labs were independently interpreted: Chemistry and show CKD I independently reviewed the following imaging with scope of interpretation limited to determining acute life threatening conditions related to emergency care: Chest x-ray and agree with the radiologist interpretation with the following exceptions: none I personally reviewed and interpreted cardiac monitoring:  Paced rhythm I personally reviewed and interpreted the pt's EKG: see above for interpretation  I have reviewed the patients home medications and made adjustments as needed   Social Determinants of health:  Elderly  Portions of this note were generated with Scientist, clinical (histocompatibility and immunogenetics). Dictation errors may occur despite best attempts at proofreading.     Final Clinical Impression(s) / ED Diagnoses Final diagnoses:  Diarrhea, unspecified type  Palpitations    Rx / DC Orders ED Discharge Orders     None         Rondel Baton, MD 04/22/23 [phone]

## 2023-04-22 NOTE — Discharge Instructions (Addendum)
You were seen for your palpitations in the emergency department.   At home, please take Imodium for your diarrhea.  This can be obtained over-the-counter.    Follow-up with your primary doctor in the next 1-2 weeks regarding your visit. Contact your cardiologist about an appointment.  Return immediately to the emergency department if you experience any of the following: Chest pain, shortness of breath, fainting, or any other concerning symptoms.    Thank you for visiting our Emergency Department. It was a pleasure taking care of you today.

## 2023-04-22 NOTE — ED Triage Notes (Signed)
Pt states that he has had some sob and yesterday he began having diarrhea and wanted to be evaluated.  Pt reports that he has had 3 episodes of diarrhea (loose stool) since yesterday. No recent abt use, no CP.

## 2023-04-29 DIAGNOSIS — F419 Anxiety disorder, unspecified: Secondary | ICD-10-CM | POA: Diagnosis not present

## 2023-04-29 DIAGNOSIS — N183 Chronic kidney disease, stage 3 unspecified: Secondary | ICD-10-CM | POA: Diagnosis not present

## 2023-04-29 DIAGNOSIS — E1122 Type 2 diabetes mellitus with diabetic chronic kidney disease: Secondary | ICD-10-CM | POA: Diagnosis not present

## 2023-04-29 DIAGNOSIS — K219 Gastro-esophageal reflux disease without esophagitis: Secondary | ICD-10-CM | POA: Diagnosis not present

## 2023-06-09 DIAGNOSIS — L570 Actinic keratosis: Secondary | ICD-10-CM | POA: Diagnosis not present

## 2023-06-09 DIAGNOSIS — Q825 Congenital non-neoplastic nevus: Secondary | ICD-10-CM | POA: Diagnosis not present

## 2023-06-09 DIAGNOSIS — W908XXD Exposure to other nonionizing radiation, subsequent encounter: Secondary | ICD-10-CM | POA: Diagnosis not present

## 2023-06-09 DIAGNOSIS — L821 Other seborrheic keratosis: Secondary | ICD-10-CM | POA: Diagnosis not present

## 2023-06-09 DIAGNOSIS — L578 Other skin changes due to chronic exposure to nonionizing radiation: Secondary | ICD-10-CM | POA: Diagnosis not present

## 2023-06-22 DIAGNOSIS — Z800 Family history of malignant neoplasm of digestive organs: Secondary | ICD-10-CM | POA: Diagnosis not present

## 2023-06-22 DIAGNOSIS — K219 Gastro-esophageal reflux disease without esophagitis: Secondary | ICD-10-CM | POA: Diagnosis not present

## 2023-06-22 DIAGNOSIS — Z1211 Encounter for screening for malignant neoplasm of colon: Secondary | ICD-10-CM | POA: Diagnosis not present

## 2023-07-13 DIAGNOSIS — Z96651 Presence of right artificial knee joint: Secondary | ICD-10-CM | POA: Diagnosis not present

## 2023-07-13 DIAGNOSIS — M1712 Unilateral primary osteoarthritis, left knee: Secondary | ICD-10-CM | POA: Diagnosis not present

## 2023-07-20 DIAGNOSIS — E1122 Type 2 diabetes mellitus with diabetic chronic kidney disease: Secondary | ICD-10-CM | POA: Diagnosis not present

## 2023-07-20 DIAGNOSIS — I152 Hypertension secondary to endocrine disorders: Secondary | ICD-10-CM | POA: Diagnosis not present

## 2023-07-20 DIAGNOSIS — Z0000 Encounter for general adult medical examination without abnormal findings: Secondary | ICD-10-CM | POA: Diagnosis not present

## 2023-07-20 DIAGNOSIS — E1159 Type 2 diabetes mellitus with other circulatory complications: Secondary | ICD-10-CM | POA: Diagnosis not present

## 2023-07-20 DIAGNOSIS — I129 Hypertensive chronic kidney disease with stage 1 through stage 4 chronic kidney disease, or unspecified chronic kidney disease: Secondary | ICD-10-CM | POA: Diagnosis not present

## 2023-07-20 DIAGNOSIS — H6122 Impacted cerumen, left ear: Secondary | ICD-10-CM | POA: Diagnosis not present

## 2023-07-20 DIAGNOSIS — E782 Mixed hyperlipidemia: Secondary | ICD-10-CM | POA: Diagnosis not present

## 2023-07-20 DIAGNOSIS — D649 Anemia, unspecified: Secondary | ICD-10-CM | POA: Diagnosis not present

## 2023-07-20 DIAGNOSIS — N183 Chronic kidney disease, stage 3 unspecified: Secondary | ICD-10-CM | POA: Diagnosis not present

## 2023-08-14 ENCOUNTER — Other Ambulatory Visit: Payer: Self-pay

## 2023-08-14 ENCOUNTER — Emergency Department (HOSPITAL_BASED_OUTPATIENT_CLINIC_OR_DEPARTMENT_OTHER)

## 2023-08-14 ENCOUNTER — Emergency Department (HOSPITAL_BASED_OUTPATIENT_CLINIC_OR_DEPARTMENT_OTHER)
Admission: EM | Admit: 2023-08-14 | Discharge: 2023-08-14 | Disposition: A | Discharge status: Home / Self Care | Attending: Emergency Medicine | Admitting: Emergency Medicine

## 2023-08-14 ENCOUNTER — Encounter (HOSPITAL_BASED_OUTPATIENT_CLINIC_OR_DEPARTMENT_OTHER): Payer: Self-pay | Admitting: Emergency Medicine

## 2023-08-14 DIAGNOSIS — Z7984 Long term (current) use of oral hypoglycemic drugs: Secondary | ICD-10-CM | POA: Insufficient documentation

## 2023-08-14 DIAGNOSIS — E119 Type 2 diabetes mellitus without complications: Secondary | ICD-10-CM | POA: Insufficient documentation

## 2023-08-14 DIAGNOSIS — K2100 Gastro-esophageal reflux disease with esophagitis, without bleeding: Secondary | ICD-10-CM | POA: Insufficient documentation

## 2023-08-14 DIAGNOSIS — Z7902 Long term (current) use of antithrombotics/antiplatelets: Secondary | ICD-10-CM | POA: Insufficient documentation

## 2023-08-14 DIAGNOSIS — R0789 Other chest pain: Secondary | ICD-10-CM

## 2023-08-14 DIAGNOSIS — Z950 Presence of cardiac pacemaker: Secondary | ICD-10-CM | POA: Insufficient documentation

## 2023-08-14 DIAGNOSIS — Z79899 Other long term (current) drug therapy: Secondary | ICD-10-CM | POA: Diagnosis not present

## 2023-08-14 DIAGNOSIS — I10 Essential (primary) hypertension: Secondary | ICD-10-CM | POA: Diagnosis not present

## 2023-08-14 DIAGNOSIS — R079 Chest pain, unspecified: Secondary | ICD-10-CM | POA: Diagnosis not present

## 2023-08-14 LAB — CBC WITH DIFFERENTIAL/PLATELET
Abs Immature Granulocytes: 0.05 10*3/uL (ref 0.00–0.07)
Basophils Absolute: 0 10*3/uL (ref 0.0–0.1)
Basophils Relative: 0 %
Eosinophils Absolute: 0.2 10*3/uL (ref 0.0–0.5)
Eosinophils Relative: 2 %
HCT: 38.2 % — ABNORMAL LOW (ref 39.0–52.0)
Hemoglobin: 13 g/dL (ref 13.0–17.0)
Immature Granulocytes: 1 %
Lymphocytes Relative: 9 %
Lymphs Abs: 0.9 10*3/uL (ref 0.7–4.0)
MCH: 31 pg (ref 26.0–34.0)
MCHC: 34 g/dL (ref 30.0–36.0)
MCV: 91 fL (ref 80.0–100.0)
Monocytes Absolute: 0.7 10*3/uL (ref 0.1–1.0)
Monocytes Relative: 6 %
Neutro Abs: 9 10*3/uL — ABNORMAL HIGH (ref 1.7–7.7)
Neutrophils Relative %: 82 %
Platelets: 153 10*3/uL (ref 150–400)
RBC: 4.2 MIL/uL — ABNORMAL LOW (ref 4.22–5.81)
RDW: 13.2 % (ref 11.5–15.5)
WBC: 10.9 10*3/uL — ABNORMAL HIGH (ref 4.0–10.5)
nRBC: 0 % (ref 0.0–0.2)

## 2023-08-14 LAB — LIPASE, BLOOD: Lipase: 31 U/L (ref 11–51)

## 2023-08-14 LAB — COMPREHENSIVE METABOLIC PANEL WITH GFR
ALT: 19 U/L (ref 0–44)
AST: 22 U/L (ref 15–41)
Albumin: 4 g/dL (ref 3.5–5.0)
Alkaline Phosphatase: 59 U/L (ref 38–126)
Anion gap: 9 (ref 5–15)
BUN: 28 mg/dL — ABNORMAL HIGH (ref 8–23)
CO2: 26 mmol/L (ref 22–32)
Calcium: 9.1 mg/dL (ref 8.9–10.3)
Chloride: 106 mmol/L (ref 98–111)
Creatinine, Ser: 1.6 mg/dL — ABNORMAL HIGH (ref 0.61–1.24)
GFR, Estimated: 44 mL/min — ABNORMAL LOW (ref >60)
Glucose, Bld: 135 mg/dL — ABNORMAL HIGH (ref 70–99)
Potassium: 4.5 mmol/L (ref 3.5–5.1)
Sodium: 141 mmol/L (ref 135–145)
Total Bilirubin: 0.8 mg/dL (ref 0.0–1.2)
Total Protein: 6.9 g/dL (ref 6.5–8.1)

## 2023-08-14 LAB — TROPONIN I (HIGH SENSITIVITY): Troponin I (High Sensitivity): 8 ng/L (ref <18)

## 2023-08-14 MED ORDER — ALUM & MAG HYDROXIDE-SIMETH 200-200-20 MG/5ML PO SUSP
30.0000 mL | Freq: Once | ORAL | Status: AC
  Administered 2023-08-14: 30 mL via ORAL
  Filled 2023-08-14: qty 30

## 2023-08-14 MED ORDER — SODIUM CHLORIDE 0.9 % IV SOLN
INTRAVENOUS | Status: DC | PRN
  Administered 2023-08-14: 10 mL/h via INTRAVENOUS

## 2023-08-14 MED ORDER — FAMOTIDINE IN NACL 20-0.9 MG/50ML-% IV SOLN
20.0000 mg | Freq: Once | INTRAVENOUS | Status: AC
Start: 2023-08-14 — End: 2023-08-14
  Administered 2023-08-14: 20 mg via INTRAVENOUS
  Filled 2023-08-14: qty 50

## 2023-08-14 MED ORDER — SUCRALFATE 1 G PO TABS: 1.0000 g | ORAL_TABLET | Freq: Two times a day (BID) | ORAL | 0 refills | Status: AC

## 2023-08-14 NOTE — Discharge Instructions (Addendum)
 Continue Carafate as prescribed.  Continue your reflux medications.  As we discussed try to do your best to avoid foods that cause acid reflux.  Keep a pretty simple bland diet for the next 24 to 48 hours.  Return if you develop worsening symptoms as we discussed especially exertional chest pain.  Consider following up with your primary care doctor and cardiologist.

## 2023-08-14 NOTE — ED Notes (Signed)
 ED Provider at bedside.

## 2023-08-14 NOTE — ED Provider Notes (Signed)
 [facility] Provider Note   CSN: [id number] Arrival date & time: 08/14/23  1302     History  Chief Complaint  Patient presents with   Arm Pain   Chest Pain    Nathaniel Davis is a 78 y.o. male.  The history is provided by the patient.  Chest Pain Pain location:  Substernal area Pain quality: burning   Pain radiates to:  Does not radiate Pain severity:  Mild Onset quality:  Gradual Duration:  12 hours Timing:  Constant Progression:  Unchanged Chronicity:  New Context: eating   Context comment:  Ate BBQ late last night woke up this morning at 3am and took gaviscon with minimal help, also been having left shoulder pain while moving lots of bxes recently. Sour taste in mouth, pain upper chest,throat Relieved by:  Nothing Worsened by:  Nothing Associated symptoms: no altered mental status, no cough, no dysphagia, no lower extremity edema and no shortness of breath   Risk factors: diabetes mellitus and hypertension   Risk factors: no smoking        Home Medications Prior to Admission medications   Medication Sig Start Date End Date Taking? Authorizing Provider  sucralfate (CARAFATE) 1 g tablet Take 1 tablet (1 g total) by mouth 2 (two) times daily for 14 days. 08/14/23 08/28/23 Yes Jamarea Selner, DO  ALPRAZolam (XANAX) 1 MG tablet Take 0.5 mg by mouth See admin instructions. Take 1/2 tablet (0.5 mg) by mouth daily at bedtime, may also take 1/2 tablet 2 times daily as needed for anxiety    [provider]  amoxicillin-clavulanate (AUGMENTIN) 875-125 MG tablet Take 1 tablet by mouth every 12 (twelve) hours. 08/29/17   McDonald, Mia A, PA-C  atenolol (TENORMIN) 50 MG tablet Take 25 mg by mouth daily.     [provider]  carbamazepine (TEGRETOL) 200 MG tablet Take 400 mg by mouth 3 (three) times daily.     [provider]  ciprofloxacin (CIPRO) 500 MG tablet Take 1 tablet (500 mg total) by mouth 2 (two) times  daily. One po bid x 7 days 12/19/17   Wynetta Heckle, MD  clobetasol (TEMOVATE) 0.05 % external solution Apply 1 application topically See admin instructions. Apply to scalp at bedtime as needed for itching 09/28/13   [provider]  clopidogrel (PLAVIX) 75 MG tablet Take 1 tablet (75 mg total) by mouth daily. 12/09/15   Elton Ham, MD  diphenoxylate-atropine (LOMOTIL) 2.5-0.025 MG per tablet Take 1 tablet by mouth 2 (two) times daily as needed for diarrhea or loose stools.     [provider]  doxepin (SINEQUAN) 25 MG capsule Take 100 mg by mouth at bedtime.     [provider]  erythromycin ophthalmic ointment Place a 1/2 inch ribbon of ointment into the left and the right lower eyelids every 6 hours while you are awake for 5 days. 08/29/17   McDonald, Mia A, PA-C  HYDROcodone-acetaminophen (NORCO) 10-325 MG per tablet Take 1 tablet by mouth every 6 (six) hours as needed (pain).     [provider]  hydrOXYzine (ATARAX/VISTARIL) 25 MG tablet Take 25 mg by mouth 3 (three) times daily as needed for itching.     [provider]  indomethacin (INDOCIN) 50 MG capsule Take 50 mg by mouth 3 (three) times daily with meals.     [provider]  lisinopril (PRINIVIL,ZESTRIL) 5 MG tablet Take 5 mg by mouth daily. 11/28/15   [provider]  meclizine (ANTIVERT) 12.5 MG tablet Take 12.5 mg by mouth 3 (three) times daily. 11/28/15   [provider]  metFORMIN (GLUCOPHAGE) 500 MG tablet Take 500 mg by mouth 3 (three) times daily with meals.    [provider]  pantoprazole (PROTONIX) 40 MG tablet Take 40 mg by mouth daily after supper.     [provider]  pravastatin (PRAVACHOL) 40 MG tablet Take 1 tablet (40 mg total) by mouth daily. 12/09/15   Elton Ham, MD  promethazine (PHENERGAN) 25 MG suppository Place 25 mg rectally every 6 (six) hours as needed for nausea or vomiting.    [provider]  promethazine  (PHENERGAN) 25 MG tablet Take 25 mg by mouth 2 (two) times daily as needed for nausea or vomiting.     [provider]  sertraline (ZOLOFT) 100 MG tablet Take 150 mg by mouth daily.     [provider]      Allergies    Crestor [rosuvastatin], Lunesta [eszopiclone], Iodinated contrast media, and Sulfa antibiotics    Review of Systems   Review of Systems  HENT:  Negative for trouble swallowing.   Respiratory:  Negative for cough and shortness of breath.     Physical Exam Updated Vital Signs  ED Triage Vitals  Encounter Vitals Group     BP 08/14/23 1314 (!) 141/82     Systolic BP Percentile --      Diastolic BP Percentile --      Pulse Rate 08/14/23 1314 64     Resp 08/14/23 1314 18     Temp 08/14/23 1314 98.8 F (37.1 C)     Temp src --      SpO2 08/14/23 1314 97 %     Weight 08/14/23 1314 175 lb (79.4 kg)     Height 08/14/23 1314 5' 9.5" (1.765 m)     Head Circumference --      Peak Flow --      Pain Score 08/14/23 1313 3     Pain Loc --      Pain Education --      Exclude from Growth Chart --     Physical Exam Vitals and nursing note reviewed.  Constitutional:      General: He is not in acute distress.    Appearance: He is well-developed. He is not ill-appearing.  HENT:     Head: Normocephalic and atraumatic.  Eyes:     Extraocular Movements: Extraocular movements intact.     Conjunctiva/sclera: Conjunctivae normal.     Pupils: Pupils are equal, round, and reactive to light.  Cardiovascular:     Rate and Rhythm: Normal rate and regular rhythm.     Pulses:          Radial pulses are 2+ on the right side and 2+ on the left side.     Heart sounds: Normal heart sounds. No murmur heard. Pulmonary:     Effort: Pulmonary effort is normal. No respiratory distress.     Breath sounds: Normal breath sounds.  Abdominal:     Palpations: Abdomen is soft.     Tenderness: There is no abdominal tenderness.  Musculoskeletal:        General: No swelling.  Normal range of motion.     Cervical back: Normal range of motion and neck supple.     Right lower leg: No edema.     Left lower leg: No edema.  Skin:    General: Skin  is warm and dry.     Capillary Refill: Capillary refill takes less than 2 seconds.  Neurological:     General: No focal deficit present.     Mental Status: He is alert.  Psychiatric:        Mood and Affect: Mood normal.     ED Results / Procedures / Treatments   Labs (all labs ordered are listed, but only abnormal results are displayed) Labs Reviewed  CBC WITH DIFFERENTIAL/PLATELET - Abnormal; Notable for the following components:      Result Value   WBC 10.9 (*)    RBC 4.20 (*)    HCT 38.2 (*)    Neutro Abs 9.0 (*)    All other components within normal limits  COMPREHENSIVE METABOLIC PANEL WITH GFR - Abnormal; Notable for the following components:   Glucose, Bld 135 (*)    BUN 28 (*)    Creatinine, Ser 1.60 (*)    GFR, Estimated 44 (*)    All other components within normal limits  LIPASE, BLOOD  TROPONIN I (HIGH SENSITIVITY)    EKG EKG Interpretation Date/Time:  Saturday August 14 2023 13:16:52 EDT Ventricular Rate:  63 PR Interval:  210 QRS Duration:  94 QT Interval:  396 QTC Calculation: 405 R Axis:   24  Text Interpretation: Atrial-paced rhythm with prolonged AV conduction When compared with ECG of 22-Apr-2023 10:27, PREVIOUS ECG IS PRESENT Confirmed by Lowery Rue [phone]) on 08/14/2023 1:28:13 PM  Radiology DG Chest Portable 1 View Result Date: 08/14/2023 CLINICAL DATA:  Chest pain EXAM: PORTABLE CHEST 1 VIEW COMPARISON:  Chest x-ray performed April 22, 2023 FINDINGS: The left-sided implant is present with leads terminating in the heart. Mild hypoventilatory changes. No pleural effusion or pneumothorax. IMPRESSION: No active disease. Electronically Signed   By: Reagan Camera M.D.   On: 08/14/2023 14:06    Procedures Procedures    Medications Ordered in ED Medications  0.9 %  sodium  chloride infusion (10 mL/hr Intravenous New Bag/Given 08/14/23 1349)  alum & mag hydroxide-simeth (MAALOX/MYLANTA) 200-200-20 MG/5ML suspension 30 mL (30 mLs Oral Given 08/14/23 1349)  famotidine (PEPCID) IVPB 20 mg premix (20 mg Intravenous New Bag/Given 08/14/23 1350)    ED Course/ Medical Decision Making/ A&P                                 Medical Decision Making Amount and/or Complexity of Data Reviewed Labs: ordered. Radiology: ordered.  Risk OTC drugs. Prescription drug management.   Darell Saputo is here with chest pain.  Heartburn type symptoms since 3 AM constant despite some Gaviscon at home.  He states he ate barbecue last night pretty late before going to bed.  Woke up with his heartburn type symptoms that he will have at times.  Feels kind of sour feeling in the middle of his chest closer up to his throat.  He has also had some left shoulder discomfort the last couple weeks while doing some moving of boxes.  He is not having any radiation of pain to his shoulder at this time.  No exertional symptoms.  History of sick sinus syndrome status post pacemaker, history of diabetes hypertension.  EKG here shows paced rhythm with no ischemic changes.  Differential diagnosis likely reflux related process but will evaluate for ACS with troponin chest x-ray basic labs.  Seems less likely to be pancreatitis cholecystitis pneumonia pneumothorax.  Will give Maalox and Pepcid IV  and reevaluate.  Per my review interpretation of labs no significant leukocytosis anemia or electrolyte abnormality.  Troponin unremarkable.  Creatinine at baseline at 1.6.  Chest x-ray showed no evidence of pneumonia pneumothorax.  Overall pain is much improved following GI cocktail Pepcid in the ED.  Pains been ongoing for about 12 to 13 hours prior to coming here.  Troponins normal.  Do not think there is benefit to repeat a troponin because of this.  History and physical seems pretty consistent with acid reflux.  We  discussed how adding Carafate may help his symptoms.  He is not having any exertional symptoms and has done well recently with activities.  I did recommend that he follow-up with cardiology and primary care and to return to the ED if symptoms worsen or he develops exertional pain.  Pain in the left shoulder at times seems muscular.  He is Pain with movement here today.  He does not describe radiation of pain to the left shoulder today.  He he says the acid taste in his throat has really improved he is coughing less as well.  We talked about keeping a bland diet for the next 24 to 48 hours.  Adding Carafate.  He is on Protonix.  He has no PE risk factors, Wells criteria 0 and doubt PE.  Ultimately patient discharged.  Understands return precautions.  This chart was dictated using voice recognition software.  Despite best efforts to proofread,  errors can occur which can change the documentation meaning.         Final Clinical Impression(s) / ED Diagnoses Final diagnoses:  Atypical chest pain  Gastroesophageal reflux disease with esophagitis without hemorrhage    Rx / DC Orders ED Discharge Orders          Ordered    sucralfate (CARAFATE) 1 g tablet  2 times daily        08/14/23 1427              Pina Sirianni, Raylene Calamity, DO 08/14/23 1436

## 2023-08-14 NOTE — ED Triage Notes (Signed)
 Pt c/o LUE pain for 3 wks, intermittently; sts he has been moving a lot of boxes in that time; he reports "heartburn" since 3am; his usual medications have not helped

## 2023-08-17 DIAGNOSIS — R051 Acute cough: Secondary | ICD-10-CM | POA: Diagnosis not present

## 2023-08-17 DIAGNOSIS — K219 Gastro-esophageal reflux disease without esophagitis: Secondary | ICD-10-CM | POA: Diagnosis not present

## 2023-08-20 DIAGNOSIS — D649 Anemia, unspecified: Secondary | ICD-10-CM | POA: Diagnosis not present

## 2023-09-06 DIAGNOSIS — Z45010 Encounter for checking and testing of cardiac pacemaker pulse generator [battery]: Secondary | ICD-10-CM | POA: Diagnosis not present

## 2023-09-08 DIAGNOSIS — Z45010 Encounter for checking and testing of cardiac pacemaker pulse generator [battery]: Secondary | ICD-10-CM | POA: Diagnosis not present

## 2023-09-09 DIAGNOSIS — E119 Type 2 diabetes mellitus without complications: Secondary | ICD-10-CM | POA: Diagnosis not present

## 2023-09-09 DIAGNOSIS — H2513 Age-related nuclear cataract, bilateral: Secondary | ICD-10-CM | POA: Diagnosis not present

## 2023-09-09 DIAGNOSIS — H35363 Drusen (degenerative) of macula, bilateral: Secondary | ICD-10-CM | POA: Diagnosis not present

## 2023-09-09 DIAGNOSIS — Z0100 Encounter for examination of eyes and vision without abnormal findings: Secondary | ICD-10-CM | POA: Diagnosis not present

## 2023-10-11 DIAGNOSIS — G6289 Other specified polyneuropathies: Secondary | ICD-10-CM | POA: Diagnosis not present

## 2023-10-22 DIAGNOSIS — D649 Anemia, unspecified: Secondary | ICD-10-CM | POA: Diagnosis not present

## 2023-12-20 DIAGNOSIS — G6289 Other specified polyneuropathies: Secondary | ICD-10-CM | POA: Diagnosis not present

## 2024-01-20 DIAGNOSIS — D696 Thrombocytopenia, unspecified: Secondary | ICD-10-CM | POA: Diagnosis not present

## 2024-01-20 DIAGNOSIS — D649 Anemia, unspecified: Secondary | ICD-10-CM | POA: Diagnosis not present

## 2024-01-25 DIAGNOSIS — E782 Mixed hyperlipidemia: Secondary | ICD-10-CM | POA: Diagnosis not present

## 2024-01-25 DIAGNOSIS — I152 Hypertension secondary to endocrine disorders: Secondary | ICD-10-CM | POA: Diagnosis not present

## 2024-01-25 DIAGNOSIS — I10 Essential (primary) hypertension: Secondary | ICD-10-CM | POA: Diagnosis not present

## 2024-01-25 DIAGNOSIS — G459 Transient cerebral ischemic attack, unspecified: Secondary | ICD-10-CM | POA: Diagnosis not present

## 2024-01-25 DIAGNOSIS — G63 Polyneuropathy in diseases classified elsewhere: Secondary | ICD-10-CM | POA: Diagnosis not present

## 2024-01-25 DIAGNOSIS — Z23 Encounter for immunization: Secondary | ICD-10-CM | POA: Diagnosis not present

## 2024-01-25 DIAGNOSIS — E1159 Type 2 diabetes mellitus with other circulatory complications: Secondary | ICD-10-CM | POA: Diagnosis not present

## 2024-02-03 DIAGNOSIS — R002 Palpitations: Secondary | ICD-10-CM | POA: Diagnosis not present

## 2024-02-03 DIAGNOSIS — Z45018 Encounter for adjustment and management of other part of cardiac pacemaker: Secondary | ICD-10-CM | POA: Diagnosis not present

## 2024-02-03 DIAGNOSIS — R42 Dizziness and giddiness: Secondary | ICD-10-CM | POA: Diagnosis not present

## 2024-02-03 DIAGNOSIS — I471 Supraventricular tachycardia, unspecified: Secondary | ICD-10-CM | POA: Diagnosis not present

## 2024-02-03 DIAGNOSIS — R5383 Other fatigue: Secondary | ICD-10-CM | POA: Diagnosis not present

## 2024-02-03 DIAGNOSIS — G459 Transient cerebral ischemic attack, unspecified: Secondary | ICD-10-CM | POA: Diagnosis not present

## 2024-02-03 DIAGNOSIS — R001 Bradycardia, unspecified: Secondary | ICD-10-CM | POA: Diagnosis not present

## 2024-02-03 DIAGNOSIS — F419 Anxiety disorder, unspecified: Secondary | ICD-10-CM | POA: Diagnosis not present

## 2024-02-28 DIAGNOSIS — R002 Palpitations: Secondary | ICD-10-CM | POA: Diagnosis not present

## 2024-02-28 DIAGNOSIS — I442 Atrioventricular block, complete: Secondary | ICD-10-CM | POA: Diagnosis not present

## 2024-02-28 DIAGNOSIS — Z95810 Presence of automatic (implantable) cardiac defibrillator: Secondary | ICD-10-CM | POA: Diagnosis not present

## 2024-02-28 DIAGNOSIS — I471 Supraventricular tachycardia, unspecified: Secondary | ICD-10-CM | POA: Diagnosis not present

## 2024-02-28 DIAGNOSIS — Z45018 Encounter for adjustment and management of other part of cardiac pacemaker: Secondary | ICD-10-CM | POA: Diagnosis not present

## 2024-02-28 DIAGNOSIS — R001 Bradycardia, unspecified: Secondary | ICD-10-CM | POA: Diagnosis not present

## 2024-03-20 DIAGNOSIS — K3189 Other diseases of stomach and duodenum: Secondary | ICD-10-CM | POA: Diagnosis not present

## 2024-03-20 DIAGNOSIS — D123 Benign neoplasm of transverse colon: Secondary | ICD-10-CM | POA: Diagnosis not present

## 2024-03-20 DIAGNOSIS — K317 Polyp of stomach and duodenum: Secondary | ICD-10-CM | POA: Diagnosis not present

## 2024-03-20 DIAGNOSIS — I4719 Other supraventricular tachycardia: Secondary | ICD-10-CM | POA: Diagnosis not present

## 2024-03-20 DIAGNOSIS — D125 Benign neoplasm of sigmoid colon: Secondary | ICD-10-CM | POA: Diagnosis not present

## 2024-03-20 DIAGNOSIS — Z1211 Encounter for screening for malignant neoplasm of colon: Secondary | ICD-10-CM | POA: Diagnosis not present

## 2024-03-20 DIAGNOSIS — K635 Polyp of colon: Secondary | ICD-10-CM | POA: Diagnosis not present

## 2024-03-20 DIAGNOSIS — K2900 Acute gastritis without bleeding: Secondary | ICD-10-CM | POA: Diagnosis not present

## 2024-04-04 DIAGNOSIS — R0982 Postnasal drip: Secondary | ICD-10-CM | POA: Diagnosis not present

## 2024-04-25 ENCOUNTER — Other Ambulatory Visit: Payer: Self-pay

## 2024-04-25 ENCOUNTER — Encounter (HOSPITAL_BASED_OUTPATIENT_CLINIC_OR_DEPARTMENT_OTHER): Payer: Self-pay | Admitting: Emergency Medicine

## 2024-04-25 ENCOUNTER — Emergency Department (HOSPITAL_BASED_OUTPATIENT_CLINIC_OR_DEPARTMENT_OTHER)
Admission: EM | Admit: 2024-04-25 | Discharge: 2024-04-25 | Disposition: A | Discharge status: Home / Self Care | Attending: Emergency Medicine | Admitting: Emergency Medicine

## 2024-04-25 DIAGNOSIS — U071 COVID-19: Secondary | ICD-10-CM | POA: Diagnosis not present

## 2024-04-25 DIAGNOSIS — R059 Cough, unspecified: Secondary | ICD-10-CM | POA: Diagnosis present

## 2024-04-25 DIAGNOSIS — J0110 Acute frontal sinusitis, unspecified: Secondary | ICD-10-CM | POA: Insufficient documentation

## 2024-04-25 DIAGNOSIS — Z7902 Long term (current) use of antithrombotics/antiplatelets: Secondary | ICD-10-CM | POA: Insufficient documentation

## 2024-04-25 DIAGNOSIS — H1031 Unspecified acute conjunctivitis, right eye: Secondary | ICD-10-CM | POA: Diagnosis not present

## 2024-04-25 DIAGNOSIS — J019 Acute sinusitis, unspecified: Secondary | ICD-10-CM | POA: Diagnosis not present

## 2024-04-25 LAB — RESP PANEL BY RT-PCR (RSV, FLU A&B, COVID)  RVPGX2
Influenza A by PCR: NEGATIVE
Influenza B by PCR: NEGATIVE
Resp Syncytial Virus by PCR: NEGATIVE
SARS Coronavirus 2 by RT PCR: POSITIVE — AB

## 2024-04-25 LAB — GROUP A STREP BY PCR: Group A Strep by PCR: NOT DETECTED

## 2024-04-25 MED ORDER — POLYMYXIN B-TRIMETHOPRIM 10000-0.1 UNIT/ML-% OP SOLN: 1.0000 [drp] | OPHTHALMIC | 0 refills | Status: AC

## 2024-04-25 MED ORDER — AMOXICILLIN-POT CLAVULANATE 875-125 MG PO TABS: 1.0000 | ORAL_TABLET | Freq: Two times a day (BID) | ORAL | 0 refills | Status: AC

## 2024-04-25 NOTE — ED Triage Notes (Signed)
 Sore throat x 2 days , recent nasal drainage he said . Denies cough or fever .

## 2024-04-25 NOTE — ED Provider Notes (Signed)
 " [place] [facility] Provider Note   CSN: [id number] Arrival date & time: 04/25/24  9060     Patient presents with: Sore Throat   Nathaniel Davis is a 78 y.o. male.   78 yo M with a chief complaint of cough congestion going on for a couple weeks.  Has developed some crustiness to his right eye and feels like his throat is gotten a little bit more sore than it has been.  No fevers.  Eating and drinking normally.  Has tried some over-the-counter eyedrops for his right eye with some improvement.  Contact lens wear.   Sore Throat       Prior to Admission medications  Medication Sig Start Date End Date Taking? Authorizing Provider  amoxicillin -clavulanate (AUGMENTIN ) 875-125 MG tablet Take 1 tablet by mouth every 12 (twelve) hours. 04/25/24  Yes Emil Share, DO  trimethoprim -polymyxin b  (POLYTRIM ) ophthalmic solution Place 1 drop into the right eye every 4 (four) hours. 04/25/24  Yes Emil Share, DO  ALPRAZolam (XANAX) 1 MG tablet Take 0.5 mg by mouth See admin instructions. Take 1/2 tablet (0.5 mg) by mouth daily at bedtime, may also take 1/2 tablet 2 times daily as needed for anxiety    [provider]  atenolol (TENORMIN) 50 MG tablet Take 25 mg by mouth daily.     [provider]  carbamazepine  (TEGRETOL ) 200 MG tablet Take 400 mg by mouth 3 (three) times daily.     [provider]  ciprofloxacin  (CIPRO ) 500 MG tablet Take 1 tablet (500 mg total) by mouth 2 (two) times daily. One po bid x 7 days 12/19/17   Armenta Canning, MD  clobetasol (TEMOVATE) 0.05 % external solution Apply 1 application topically See admin instructions. Apply to scalp at bedtime as needed for itching 09/28/13   [provider]  clopidogrel  (PLAVIX ) 75 MG tablet Take 1 tablet (75 mg total) by mouth daily. 12/09/15   Levern Beckey HERO, MD  diphenoxylate-atropine (LOMOTIL) 2.5-0.025 MG per tablet Take 1 tablet by mouth 2 (two) times daily as needed for  diarrhea or loose stools.     [provider]  doxepin  (SINEQUAN ) 25 MG capsule Take 100 mg by mouth at bedtime.     [provider]  erythromycin  ophthalmic ointment Place a 1/2 inch ribbon of ointment into the left and the right lower eyelids every 6 hours while you are awake for 5 days. 08/29/17   McDonald, Mia A, PA-C  HYDROcodone-acetaminophen  (NORCO) 10-325 MG per tablet Take 1 tablet by mouth every 6 (six) hours as needed (pain).     [provider]  hydrOXYzine (ATARAX/VISTARIL) 25 MG tablet Take 25 mg by mouth 3 (three) times daily as needed for itching.     [provider]  indomethacin (INDOCIN) 50 MG capsule Take 50 mg by mouth 3 (three) times daily with meals.     [provider]  lisinopril (PRINIVIL,ZESTRIL) 5 MG tablet Take 5 mg by mouth daily. 11/28/15   [provider]  meclizine (ANTIVERT) 12.5 MG tablet Take 12.5 mg by mouth 3 (three) times daily. 11/28/15   [provider]  metFORMIN (GLUCOPHAGE) 500 MG tablet Take 500 mg by mouth 3 (three) times daily with meals.    [provider]  pantoprazole  (PROTONIX ) 40 MG tablet Take 40 mg by mouth daily after supper.     [provider]  pravastatin  (PRAVACHOL ) 40 MG tablet Take 1 tablet (40 mg total) by mouth  daily. 12/09/15   Levern Beckey HERO, MD  promethazine (PHENERGAN) 25 MG suppository Place 25 mg rectally every 6 (six) hours as needed for nausea or vomiting.    [provider]  promethazine (PHENERGAN) 25 MG tablet Take 25 mg by mouth 2 (two) times daily as needed for nausea or vomiting.     [provider]  sertraline  (ZOLOFT ) 100 MG tablet Take 150 mg by mouth daily.     [provider]  sucralfate  (CARAFATE ) 1 g tablet Take 1 tablet (1 g total) by mouth 2 (two) times daily for 14 days. 08/14/23 08/28/23  Ruthe Cornet, DO    Allergies: Crestor [rosuvastatin], Lunesta [eszopiclone], Iodinated contrast media, and Sulfa antibiotics     Review of Systems  Updated Vital Signs BP (!) 141/78   Pulse 61   Temp 98.7 F (37.1 C) (Oral)   Resp 16   Wt 79.8 kg   SpO2 99%   BMI 25.62 kg/m   Physical Exam Vitals and nursing note reviewed.  Constitutional:      Appearance: He is well-developed.  HENT:     Head: Normocephalic and atraumatic.     Comments: Swollen turbinates, posterior nasal drip, no noted sinus ttp, tm normal bilaterally.   Eyes:     Pupils: Pupils are equal, round, and reactive to light.  Neck:     Vascular: No JVD.  Cardiovascular:     Rate and Rhythm: Normal rate and regular rhythm.     Heart sounds: No murmur heard.    No friction rub. No gallop.  Pulmonary:     Effort: No respiratory distress.     Breath sounds: No wheezing.  Abdominal:     General: There is no distension.     Tenderness: There is no abdominal tenderness. There is no guarding or rebound.  Musculoskeletal:        General: Normal range of motion.     Cervical back: Normal range of motion and neck supple.  Skin:    Coloration: Skin is not pale.     Findings: No rash.  Neurological:     Mental Status: He is alert and oriented to person, place, and time.  Psychiatric:        Behavior: Behavior normal.     (all labs ordered are listed, but only abnormal results are displayed) Labs Reviewed  RESP PANEL BY RT-PCR (RSV, FLU A&B, COVID)  [id number] - Abnormal; Notable for the following components:      Result Value   SARS Coronavirus 2 by RT PCR POSITIVE (*)    All other components within normal limits  GROUP A STREP BY PCR    EKG: None  Radiology: No results found.   Procedures   Medications Ordered in the ED - No data to display                                  Medical Decision Making Risk Prescription drug management.   78 yo M with a chief complaint of cough congestion going on for a couple weeks now.  Most likely residual viral illness versus gastroesophageal reflux disease.  In case this is  sinusitis will start on oral antibiotics.  In case this is bacterial conjunctivitis will start on topical eyedrops.  PCP follow-up.  10:59 AM:  I have discussed the diagnosis/risks/treatment options with the patient.  Evaluation and diagnostic testing in the emergency department does not  suggest an emergent condition requiring admission or immediate intervention beyond what has been performed at this time.  They will follow up with PCP. We also discussed returning to the ED immediately if new or worsening sx occur. We discussed the sx which are most concerning (e.g., sudden worsening pain, fever, inability to tolerate by mouth) that necessitate immediate return. Medications administered to the patient during their visit and any new prescriptions provided to the patient are listed below.  Medications given during this visit Medications - No data to display   The patient appears reasonably screen and/or stabilized for discharge and I doubt any other medical condition or other [facility] requiring further screening, evaluation, or treatment in the ED at this time prior to discharge.       Final diagnoses:  Acute frontal sinusitis, recurrence not specified  Acute bacterial conjunctivitis of right eye    ED Discharge Orders          Ordered    amoxicillin -clavulanate (AUGMENTIN ) 875-125 MG tablet  Every 12 hours        04/25/24 1010    trimethoprim -polymyxin b  (POLYTRIM ) ophthalmic solution  Every 4 hours        04/25/24 1010               Emil Share, DO 04/25/24 1059  "

## 2024-04-25 NOTE — Discharge Instructions (Signed)
 Please follow-up with your family doctor in the office.  Please return for worsening symptoms difficulty breathing.  Have a Gertha Christmas!

## 2024-05-30 NOTE — Progress Notes (Signed)
 Nathaniel Davis                                          MRN: [id number]   05/30/2024   The [facility] Quality Team Specialist reviewed this patient medical record for the purposes of chart review for care gap closure. The following were reviewed: chart review for care gap closure-kidney health evaluation for diabetes:eGFR  and uACR.    [facility] Quality Team
